# Patient Record
Sex: Female | Born: 1970 | Hispanic: Yes | Marital: Married | Smoking: Former smoker
Health system: Southern US, Community
[De-identification: ages and names within clinical notes are randomized; demographics above are authoritative.]

## PROBLEM LIST (undated history)

## (undated) DIAGNOSIS — E785 Hyperlipidemia, unspecified: Secondary | ICD-10-CM

## (undated) DIAGNOSIS — E119 Type 2 diabetes mellitus without complications: Secondary | ICD-10-CM

## (undated) DIAGNOSIS — I10 Essential (primary) hypertension: Secondary | ICD-10-CM

## (undated) HISTORY — PX: CHOLECYSTECTOMY: SHX55

## (undated) HISTORY — DX: Type 2 diabetes mellitus without complications: E11.9

## (undated) HISTORY — DX: Hyperlipidemia, unspecified: E78.5

## (undated) HISTORY — DX: Essential (primary) hypertension: I10

---

## 2007-05-06 ENCOUNTER — Ambulatory Visit: Payer: Self-pay | Admitting: Family Medicine

## 2007-06-17 ENCOUNTER — Ambulatory Visit: Payer: Self-pay | Admitting: Family Medicine

## 2007-09-16 ENCOUNTER — Ambulatory Visit: Payer: Self-pay | Admitting: Family Medicine

## 2007-12-23 HISTORY — PX: GASTRIC BYPASS: SHX52

## 2009-11-15 ENCOUNTER — Emergency Department: Payer: Self-pay | Admitting: Emergency Medicine

## 2010-08-06 LAB — HM PAP SMEAR

## 2010-11-28 ENCOUNTER — Ambulatory Visit: Payer: Self-pay

## 2011-06-08 ENCOUNTER — Emergency Department: Payer: Self-pay | Admitting: Emergency Medicine

## 2011-06-15 ENCOUNTER — Emergency Department: Payer: Self-pay | Admitting: Emergency Medicine

## 2013-04-29 ENCOUNTER — Emergency Department: Payer: Self-pay | Admitting: Unknown Physician Specialty

## 2013-04-29 LAB — HEPATIC FUNCTION PANEL A (ARMC)
Albumin: 3.7 g/dL (ref 3.4–5.0)
Alkaline Phosphatase: 108 U/L (ref 50–136)
Bilirubin,Total: 0.3 mg/dL (ref 0.2–1.0)
SGOT(AST): 26 U/L (ref 15–37)
Total Protein: 7.5 g/dL (ref 6.4–8.2)

## 2013-04-29 LAB — CBC
HGB: 14.5 g/dL (ref 12.0–16.0)
MCH: 29.8 pg (ref 26.0–34.0)
MCHC: 34.4 g/dL (ref 32.0–36.0)
MCV: 87 fL (ref 80–100)
Platelet: 223 10*3/uL (ref 150–440)
RDW: 13.3 % (ref 11.5–14.5)
WBC: 6.7 10*3/uL (ref 3.6–11.0)

## 2013-04-29 LAB — BASIC METABOLIC PANEL
Anion Gap: 8 (ref 7–16)
BUN: 12 mg/dL (ref 7–18)
Calcium, Total: 9.4 mg/dL (ref 8.5–10.1)
Glucose: 97 mg/dL (ref 65–99)
Potassium: 4.1 mmol/L (ref 3.5–5.1)

## 2013-04-29 LAB — LIPASE, BLOOD: Lipase: 102 U/L (ref 73–393)

## 2013-04-29 LAB — MAGNESIUM: Magnesium: 1.7 mg/dL — ABNORMAL LOW

## 2013-04-29 LAB — TROPONIN I: Troponin-I: <0.02 ng/mL

## 2014-07-30 ENCOUNTER — Emergency Department: Payer: Self-pay | Admitting: Emergency Medicine

## 2015-08-14 ENCOUNTER — Encounter: Payer: Self-pay | Admitting: Unknown Physician Specialty

## 2015-08-14 ENCOUNTER — Ambulatory Visit (INDEPENDENT_AMBULATORY_CARE_PROVIDER_SITE_OTHER): Payer: BLUE CROSS/BLUE SHIELD | Admitting: Unknown Physician Specialty

## 2015-08-14 VITALS — BP 112/77 | HR 69 | Temp 98.4°F | Ht 61.1 in | Wt 213.6 lb

## 2015-08-14 DIAGNOSIS — M545 Low back pain, unspecified: Secondary | ICD-10-CM

## 2015-08-14 DIAGNOSIS — E669 Obesity, unspecified: Secondary | ICD-10-CM | POA: Diagnosis not present

## 2015-08-14 DIAGNOSIS — M79609 Pain in unspecified limb: Secondary | ICD-10-CM | POA: Insufficient documentation

## 2015-08-14 DIAGNOSIS — I10 Essential (primary) hypertension: Secondary | ICD-10-CM

## 2015-08-14 DIAGNOSIS — E785 Hyperlipidemia, unspecified: Secondary | ICD-10-CM

## 2015-08-14 DIAGNOSIS — G4730 Sleep apnea, unspecified: Secondary | ICD-10-CM | POA: Insufficient documentation

## 2015-08-14 DIAGNOSIS — Z9884 Bariatric surgery status: Secondary | ICD-10-CM | POA: Diagnosis not present

## 2015-08-14 MED ORDER — TRAMADOL HCL 50 MG PO TABS: 50.0000 mg | ORAL_TABLET | Freq: Three times a day (TID) | ORAL | Status: DC | PRN

## 2015-08-14 MED ORDER — CYCLOBENZAPRINE HCL 10 MG PO TABS: 10.0000 mg | ORAL_TABLET | Freq: Three times a day (TID) | ORAL | Status: DC | PRN

## 2015-08-14 NOTE — Progress Notes (Signed)
BP 112/77 mmHg  Pulse 69  Temp(Src) 98.4 F (36.9 C)  Ht 5' 1.1" (1.552 m)  Wt 213 lb 9.6 oz (96.888 kg)  BMI 40.22 kg/m2  SpO2 100%  LMP  (LMP Unknown)   Subjective:    Patient ID: Chelsea Mayer, female    DOB: March 02, 1971, 44 y.o.   MRN: [id number]  HPI: Chelsea Mayer is a 44 y.o. female  Chief Complaint  Patient presents with  . Establish Care   Wants to get a physical.     Low back pain For 2 months.  States it comes and goes.  Worse with sitting and laying down.  Better when up and walking around.  States when she gets up she has to "slide off the bed."  Points to the left lower back and it is tender to the touch.    Relevant past medical, surgical, family and social history reviewed and updated as indicated. Interim medical history since our last visit reviewed. Allergies and medications reviewed and updated.  Review of Systems  Constitutional: Negative.   HENT: Negative.   Eyes: Negative.   Respiratory: Negative.   Cardiovascular: Negative.   Gastrointestinal: Negative.   Endocrine: Negative.   Genitourinary: Negative.   Musculoskeletal:       Having trouble with low back pain  Skin: Negative.   Allergic/Immunologic: Negative.   Neurological: Negative.   Hematological: Negative.   Psychiatric/Behavioral: Negative.     Per HPI unless specifically indicated above     Objective:    BP 112/77 mmHg  Pulse 69  Temp(Src) 98.4 F (36.9 C)  Ht 5' 1.1" (1.552 m)  Wt 213 lb 9.6 oz (96.888 kg)  BMI 40.22 kg/m2  SpO2 100%  LMP  (LMP Unknown)  Wt Readings from Last 3 Encounters:  08/14/15 213 lb 9.6 oz (96.888 kg)  03/14/11 172 lb (78.019 kg)    Physical Exam  Constitutional: She is oriented to person, place, and time. She appears well-developed and well-nourished. No distress.  HENT:  Head: Normocephalic and atraumatic.  Eyes: Conjunctivae and lids are normal. Right eye exhibits no discharge. Left eye exhibits no discharge. No scleral icterus.   Cardiovascular: Normal rate, regular rhythm and normal heart sounds.   Pulmonary/Chest: Effort normal and breath sounds normal. No respiratory distress.  Abdominal: Normal appearance and bowel sounds are normal. She exhibits no distension. There is no splenomegaly or hepatomegaly. There is no tenderness.  Musculoskeletal: Normal range of motion.  Neurological: She is alert and oriented to person, place, and time.  Skin: Skin is intact. No rash noted. No pallor.  Psychiatric: She has a normal mood and affect. Her behavior is normal. Judgment and thought content normal.  Vitals reviewed.   Results for orders placed or performed in visit on 08/14/15  HM PAP SMEAR  Result Value Ref Range   HM Pap smear from PP       Assessment & Plan:   Problem List Items Addressed This Visit      Unprioritized   S/P gastric bypass   Obesity    Other Visit Diagnoses    Left-sided low back pain without sciatica    -  Primary    Will get x-ray of LS spine.  Rx for Cyclobenzeprine TID prn.  Rx for Tramadol.  S/p gastric bypass surgery and unable to take NSAIDS.      Relevant Medications    cyclobenzaprine (FLEXERIL) 10 MG tablet    traMADol (ULTRAM) 50 MG tablet  Other Relevant Orders    DG Lumbar Spine Complete        Follow up plan: Return for physical.

## 2015-08-15 ENCOUNTER — Ambulatory Visit
Admission: RE | Admit: 2015-08-15 | Discharge: 2015-08-15 | Disposition: A | Discharge status: Home / Self Care | Payer: Self-pay | Source: Ambulatory Visit | Attending: Unknown Physician Specialty | Admitting: Unknown Physician Specialty

## 2015-08-16 ENCOUNTER — Ambulatory Visit
Admission: RE | Admit: 2015-08-16 | Discharge: 2015-08-16 | Disposition: A | Discharge status: Home / Self Care | Payer: BLUE CROSS/BLUE SHIELD | Source: Ambulatory Visit | Attending: Unknown Physician Specialty | Admitting: Unknown Physician Specialty

## 2015-08-16 DIAGNOSIS — M545 Low back pain, unspecified: Secondary | ICD-10-CM

## 2015-09-26 ENCOUNTER — Ambulatory Visit (INDEPENDENT_AMBULATORY_CARE_PROVIDER_SITE_OTHER): Payer: BLUE CROSS/BLUE SHIELD | Admitting: Unknown Physician Specialty

## 2015-09-26 ENCOUNTER — Encounter: Payer: Self-pay | Admitting: Unknown Physician Specialty

## 2015-09-26 VITALS — BP 128/89 | HR 75 | Temp 98.3°F | Ht 60.7 in | Wt 214.4 lb

## 2015-09-26 DIAGNOSIS — Z0000 Encounter for general adult medical examination without abnormal findings: Secondary | ICD-10-CM

## 2015-09-26 DIAGNOSIS — M545 Low back pain, unspecified: Secondary | ICD-10-CM | POA: Insufficient documentation

## 2015-09-26 DIAGNOSIS — Z23 Encounter for immunization: Secondary | ICD-10-CM | POA: Diagnosis not present

## 2015-09-26 DIAGNOSIS — Z9884 Bariatric surgery status: Secondary | ICD-10-CM | POA: Diagnosis not present

## 2015-09-26 NOTE — Patient Instructions (Addendum)
Use Myfitnesspal app for phone and track diet and exercise  Look for yoga exercises for back pain online.   Obesity Obesity is defined as having too much total body fat and a body mass index (BMI) of 30 or more. BMI is an estimate of body fat and is calculated from your height and weight. BMI is typically calculated by your health care provider during regular wellness visits. Obesity happens when you consume more calories than you can burn by exercising or performing daily physical tasks. Prolonged obesity can cause major illnesses or emergencies, such as: 1. Stroke. 2. Heart disease. 3. Diabetes. 4. Cancer. 5. Arthritis. 6. High blood pressure (hypertension). 7. High cholesterol. 8. Sleep apnea. 9. Erectile dysfunction. 10. Infertility problems. CAUSES  1. Regularly eating unhealthy foods. 2. Physical inactivity. 3. Certain disorders, such as an underactive thyroid (hypothyroidism), Cushing's syndrome, and polycystic ovarian syndrome. 4. Certain medicines, such as steroids, some depression medicines, and antipsychotics. 5. Genetics. 6. Lack of sleep. DIAGNOSIS A health care provider can diagnose obesity after calculating your BMI. Obesity will be diagnosed if your BMI is 30 or higher. There are other methods of measuring obesity levels. Some other methods include measuring your skinfold thickness, your waist circumference, and comparing your hip circumference to your waist circumference. TREATMENT  A healthy treatment program includes some or all of the following: 1. Long-term dietary changes. 2. Exercise and physical activity. 3. Behavioral and lifestyle changes. 4. Medicine only under the supervision of your health care provider. Medicines may help, but only if they are used with diet and exercise programs. If your BMI is 40 or higher, your health care provider may recommend specialized surgery or programs to help with weight loss. An unhealthy treatment program  includes: 1. Fasting. 2. Fad diets. 3. Supplements and drugs. These choices do not succeed in long-term weight control. HOME CARE INSTRUCTIONS 1. Exercise and perform physical activity as directed by your health care provider. To increase physical activity, try the following: 1. Use stairs instead of elevators. 2. Park farther away from store entrances. 3. Garden, bike, or walk instead of watching television or using the computer. 2. Eat healthy, low-calorie foods and drinks on a regular basis. Eat more fruits and vegetables. Use low-calorie cookbooks or take healthy cooking classes. 3. Limit fast food, sweets, and processed snack foods. 4. Eat smaller portions. 5. Keep a daily journal of everything you eat. There are many free websites to help you with this. It may be helpful to measure your foods so you can determine if you are eating the correct portion sizes. 6. Avoid drinking alcohol. Drink more water and drinks without calories. 7. Take vitamins and supplements only as recommended by your health care provider. 8. Weight-loss support groups, registered dietitians, counselors, and stress reduction education can also be very helpful. SEEK IMMEDIATE MEDICAL CARE IF: 1. You have chest pain or tightness. 2. You have trouble breathing or feel short of breath. 3. You have weakness or leg numbness. 4. You feel confused or have trouble talking. 5. You have sudden changes in your vision.   This information is not intended to replace advice given to you by your health care provider. Make sure you discuss any questions you have with your health care provider.   Document Released: 01/15/2005 Document Revised: 12/29/2014 Document Reviewed: 01/14/2012 Elsevier Interactive Patient Education 2016 [employer] Chronic Back Pain  When back pain lasts longer than 3 months, it is called chronic back pain.People with chronic back pain often go  through certain periods that are more intense (flare-ups).   CAUSES Chronic back pain can be caused by wear and tear (degeneration) on different structures in your back. These structures include: 11. The bones of your spine (vertebrae) and the joints surrounding your spinal cord and nerve roots (facets). 12. The strong, fibrous tissues that connect your vertebrae (ligaments). Degeneration of these structures may result in pressure on your nerves. This can lead to constant pain. HOME CARE INSTRUCTIONS 7. Avoid bending, heavy lifting, prolonged sitting, and activities which make the problem worse. 8. Take brief periods of rest throughout the day to reduce your pain. Lying down or standing usually is better than sitting while you are resting. 9. Take over-the-counter or prescription medicines only as directed by your caregiver. SEEK IMMEDIATE MEDICAL CARE IF:  5. You have weakness or numbness in one of your legs or feet. 6. You have trouble controlling your bladder or bowels. 7. You have nausea, vomiting, abdominal pain, shortness of breath, or fainting.   This information is not intended to replace advice given to you by your health care provider. Make sure you discuss any questions you have with your health care provider.   Document Released: 01/15/2005 Document Revised: 03/01/2012 Document Reviewed: 05/28/2015 Elsevier Interactive Patient Education 2016 Else[employer] Back Exercises The following exercises strengthen the muscles that help to support the back. They also help to keep the lower back flexible. Doing these exercises can help to prevent back pain or lessen existing pain. If you have back pain or discomfort, try doing these exercises 2-3 times each day or as told by your health care provider. When the pain goes away, do them once each day, but increase the number of times that you repeat the steps for each exercise (do more repetitions). If you do not have back pain or discomfort, do these exercises once each day or as told by your health care  provider. EXERCISES Single Knee to Chest Repeat these steps 3-5 times for each leg: 13. Lie on your back on a firm bed or the floor with your legs extended. 14. Bring one knee to your chest. Your other leg should stay extended and in contact with the floor. 87. Hold your knee in place by grabbing your knee or thigh. 16. Pull on your knee until you feel a gentle stretch in your lower back. 17. Hold the stretch for 10-30 seconds. 18. Slowly release and straighten your leg. Pelvic Tilt Repeat these steps 5-10 times: 10. Lie on your back on a firm bed or the floor with your legs extended. [place] your knees so they are pointing toward the ceiling and your feet are flat on the floor. 12. Tighten your lower abdominal muscles to press your lower back against the floor. This motion will tilt your pelvis so your tailbone points up toward the ceiling instead of pointing to your feet or the floor. 13. With gentle tension and even breathing, hold this position for 5-10 seconds. Cat-Cow Repeat these steps until your lower back becomes more flexible: 8. Get into a hands-and-knees position on a firm surface. Keep your hands under your shoulders, and keep your knees under your hips. You may place padding under your knees for comfort. 9. Let your head hang down, and point your tailbone toward the floor so your lower back becomes rounded like the back of a cat. 10. Hold this position for 5 seconds. 11. Slowly lift your head and point your tailbone up toward the ceiling so  your back forms a sagging arch like the back of a cow. 12. Hold this position for 5 seconds. Press-Ups Repeat these steps 5-10 times: 4. Lie on your abdomen (face-down) on the floor. 5. Place your palms near your head, about shoulder-width apart. 6. While you keep your back as relaxed as possible and keep your hips on the floor, slowly straighten your arms to raise the top half of your body and lift your shoulders. Do not use your back  muscles to raise your upper torso. You may adjust the placement of your hands to make yourself more comfortable. 7. Hold this position for 5 seconds while you keep your back relaxed. 8. Slowly return to lying flat on the floor. [place] Repeat these steps 10 times: 9. Lie on your back on a firm surface. [place] your knees so they are pointing toward the ceiling and your feet are flat on the floor. 69. Tighten your buttocks muscles and lift your buttocks off of the floor until your waist is at almost the same height as your knees. You should feel the muscles working in your buttocks and the back of your thighs. If you do not feel these muscles, slide your feet 1-2 inches farther away from your buttocks. 12. Hold this position for 3-5 seconds. 13. Slowly lower your hips to the starting position, and allow your buttocks muscles to relax completely. If this exercise is too easy, try doing it with your arms crossed over your chest. Abdominal Crunches Repeat these steps 5-10 times: 6. Lie on your back on a firm bed or the floor with your legs extended. 7. Bend your knees so they are pointing toward the ceiling and your feet are flat on the floor. 8. Cross your arms over your chest. 9. Tip your chin slightly toward your chest without bending your neck. 10. Tighten your abdominal muscles and slowly raise your trunk (torso) high enough to lift your shoulder blades a tiny bit off of the floor. Avoid raising your torso higher than that, because it can put too much stress on your low back and it does not help to strengthen your abdominal muscles. 11. Slowly return to your starting position. Back Lifts Repeat these steps 5-10 times: 1. Lie on your abdomen (face-down) with your arms at your sides, and rest your forehead on the floor. 2. Tighten the muscles in your legs and your buttocks. 3. Slowly lift your chest off of the floor while you keep your hips pressed to the floor. Keep the back of your head in  line with the curve in your back. Your eyes should be looking at the floor. 4. Hold this position for 3-5 seconds. 5. Slowly return to your starting position. SEEK MEDICAL CARE IF:  Your back pain or discomfort gets much worse when you do an exercise.  Your back pain or discomfort does not lessen within 2 hours after you exercise. If you have any of these problems, stop doing these exercises right away. Do not do them again unless your health care provider says that you can. SEEK IMMEDIATE MEDICAL CARE IF:  You develop sudden, severe back pain. If this happens, stop doing the exercises right away. Do not do them again unless your health care provider says that you can.   This information is not intended to replace advice given to you by your health care provider. Make sure you discuss any questions you have with your health care provider.   Document Released: 01/15/2005 Document Revised:  08/29/2015 Document Reviewed: 02/01/2015 Elsevier Interactive Patient Education [employer].

## 2015-09-26 NOTE — Assessment & Plan Note (Signed)
Pt ed on exercises and yoga videos

## 2015-09-26 NOTE — Progress Notes (Signed)
BP 128/89 mmHg  Pulse 75  Temp(Src) 98.3 F (36.8 C)  Ht 5' 0.7" (1.542 m)  Wt 214 lb 6.4 oz (97.251 kg)  BMI 40.90 kg/m2  SpO2 100%   Subjective:    Patient ID: Chelsea Mayer, female    DOB: January 11, 1971, 44 y.o.   MRN: [id number]  HPI: Chelsea Mayer is a 44 y.o. female  Chief Complaint  Patient presents with  . Annual Exam    pt states she would like to have a mammogram   Obesity She has tried exercising.  She states she drinks diet Pepsi.  States she is lactose and sugar intolerant.  Her food recall shows she eats little food besides her snacks.    Relevant past medical, surgical, family and social history reviewed and updated as indicated. Interim medical history since our last visit reviewed. Allergies and medications reviewed and updated.  Review of Systems  Constitutional: Negative.   HENT: Negative.   Eyes: Negative.   Respiratory: Negative.   Cardiovascular: Negative.   Gastrointestinal: Negative.   Endocrine: Negative.   Genitourinary: Negative.   Musculoskeletal: Positive for back pain.       Reviewed x-ray showing diffuse degenerative changes  Skin: Negative.   Allergic/Immunologic: Negative.   Neurological: Negative.   Hematological: Negative.   Psychiatric/Behavioral: Negative.     Per HPI unless specifically indicated above     Objective:    BP 128/89 mmHg  Pulse 75  Temp(Src) 98.3 F (36.8 C)  Ht 5' 0.7" (1.542 m)  Wt 214 lb 6.4 oz (97.251 kg)  BMI 40.90 kg/m2  SpO2 100%  Wt Readings from Last 3 Encounters:  09/26/15 214 lb 6.4 oz (97.251 kg)  08/14/15 213 lb 9.6 oz (96.888 kg)  03/14/11 172 lb (78.019 kg)    Physical Exam  Constitutional: She is oriented to person, place, and time. She appears well-developed and well-nourished.  HENT:  Head: Normocephalic and atraumatic.  Eyes: Pupils are equal, round, and reactive to light. Right eye exhibits no discharge. Left eye exhibits no discharge. No scleral icterus.  Neck: Normal range  of motion. Neck supple. Carotid bruit is not present. No thyromegaly present.  Cardiovascular: Normal rate, regular rhythm and normal heart sounds.  Exam reveals no gallop and no friction rub.   No murmur heard. Pulmonary/Chest: Effort normal and breath sounds normal. No respiratory distress. She has no wheezes. She has no rales.  Abdominal: Soft. Bowel sounds are normal. There is no tenderness. There is no rebound.  Genitourinary: No breast swelling, tenderness or discharge.  Musculoskeletal: Normal range of motion.  Lymphadenopathy:    She has no cervical adenopathy.  Neurological: She is alert and oriented to person, place, and time.  Skin: Skin is warm, dry and intact. No rash noted.  Psychiatric: She has a normal mood and affect. Her speech is normal and behavior is normal. Judgment and thought content normal. Cognition and memory are normal.       Assessment & Plan:   Problem List Items Addressed This Visit      Unprioritized   Low back pain    Pt ed on exercises and yoga videos       Other Visit Diagnoses    Immunization due    -  Primary    Relevant Orders    Flu Vaccine QUAD 36+ mos PF IM (Fluarix & Fluzone Quad PF) (Completed)    Annual physical exam        Relevant Orders  HIV antibody    Comprehensive metabolic panel    CBC with Differential/Platelet    TSH    IGP, Aptima HPV, rfx 16/18,45    Vit D  25 hydroxy (rtn osteoporosis monitoring)    Lipid Panel w/o Chol/HDL Ratio    Gastric bypass status for obesity        Relevant Orders    Vitamin B12        Follow up plan: Return if symptoms worsen or fail to improve.

## 2015-09-27 ENCOUNTER — Encounter: Payer: Self-pay | Admitting: Unknown Physician Specialty

## 2015-09-27 LAB — COMPREHENSIVE METABOLIC PANEL
A/G RATIO: 1.7 (ref 1.1–2.5)
ALBUMIN: 4 g/dL (ref 3.5–5.5)
ALK PHOS: 103 IU/L (ref 39–117)
ALT: 20 IU/L (ref 0–32)
AST: 17 IU/L (ref 0–40)
BUN / CREAT RATIO: 19 (ref 9–23)
BUN: 13 mg/dL (ref 6–24)
CO2: 24 mmol/L (ref 18–29)
CREATININE: 0.68 mg/dL (ref 0.57–1.00)
Calcium: 9.1 mg/dL (ref 8.7–10.2)
Chloride: 103 mmol/L (ref 97–108)
GFR calc Af Amer: 124 mL/min/{1.73_m2} (ref >59)
GFR calc non Af Amer: 107 mL/min/{1.73_m2} (ref >59)
GLOBULIN, TOTAL: 2.3 g/dL (ref 1.5–4.5)
Glucose: 99 mg/dL (ref 65–99)
Potassium: 4.3 mmol/L (ref 3.5–5.2)
Sodium: 140 mmol/L (ref 134–144)
Total Protein: 6.3 g/dL (ref 6.0–8.5)

## 2015-09-27 LAB — HIV ANTIBODY (ROUTINE TESTING W REFLEX): HIV Screen 4th Generation wRfx: NONREACTIVE

## 2015-09-27 LAB — CBC WITH DIFFERENTIAL/PLATELET
BASOS ABS: 0 10*3/uL (ref 0.0–0.2)
BASOS: 0 %
EOS (ABSOLUTE): 0.1 10*3/uL (ref 0.0–0.4)
Eos: 2 %
HEMOGLOBIN: 11.9 g/dL (ref 11.1–15.9)
Hematocrit: 35.4 % (ref 34.0–46.6)
IMMATURE GRANS (ABS): 0 10*3/uL (ref 0.0–0.1)
IMMATURE GRANULOCYTES: 0 %
LYMPHS: 32 %
Lymphocytes Absolute: 1.9 10*3/uL (ref 0.7–3.1)
MCH: 26 pg — AB (ref 26.6–33.0)
MCHC: 33.6 g/dL (ref 31.5–35.7)
MCV: 78 fL — ABNORMAL LOW (ref 79–97)
MONOCYTES: 7 %
Monocytes Absolute: 0.4 10*3/uL (ref 0.1–0.9)
NEUTROS ABS: 3.6 10*3/uL (ref 1.4–7.0)
NEUTROS PCT: 59 %
Platelets: 280 10*3/uL (ref 150–379)
RBC: 4.57 x10E6/uL (ref 3.77–5.28)
RDW: 14.4 % (ref 12.3–15.4)
WBC: 6 10*3/uL (ref 3.4–10.8)

## 2015-09-27 LAB — LIPID PANEL W/O CHOL/HDL RATIO
Cholesterol, Total: 224 mg/dL — ABNORMAL HIGH (ref 100–199)
HDL: 71 mg/dL (ref >39)
LDL CALC: 136 mg/dL — AB (ref 0–99)
Triglycerides: 85 mg/dL (ref 0–149)
VLDL CHOLESTEROL CAL: 17 mg/dL (ref 5–40)

## 2015-09-27 LAB — VITAMIN B12: Vitamin B-12: 616 pg/mL (ref 211–946)

## 2015-09-27 LAB — TSH: TSH: 1.34 u[IU]/mL (ref 0.450–4.500)

## 2015-09-27 LAB — VITAMIN D 25 HYDROXY (VIT D DEFICIENCY, FRACTURES): Vit D, 25-Hydroxy: 18 ng/mL — ABNORMAL LOW (ref 30.0–100.0)

## 2015-09-30 LAB — IGP, APTIMA HPV, RFX 16/18,45: PAP Smear Comment: 0

## 2016-03-14 IMAGING — CR DG LUMBAR SPINE COMPLETE 4+V
1 series · 5 of 5 positions shown · non-contrast
Comparison: None.

CLINICAL DATA: Back pain.

EXAM:
LUMBAR SPINE - COMPLETE 4+ VIEW

[Series 1: dg lumbar spine complete 4 +v · 0.14mm/px · 5 of 5 slices shown]
[im 1/5]
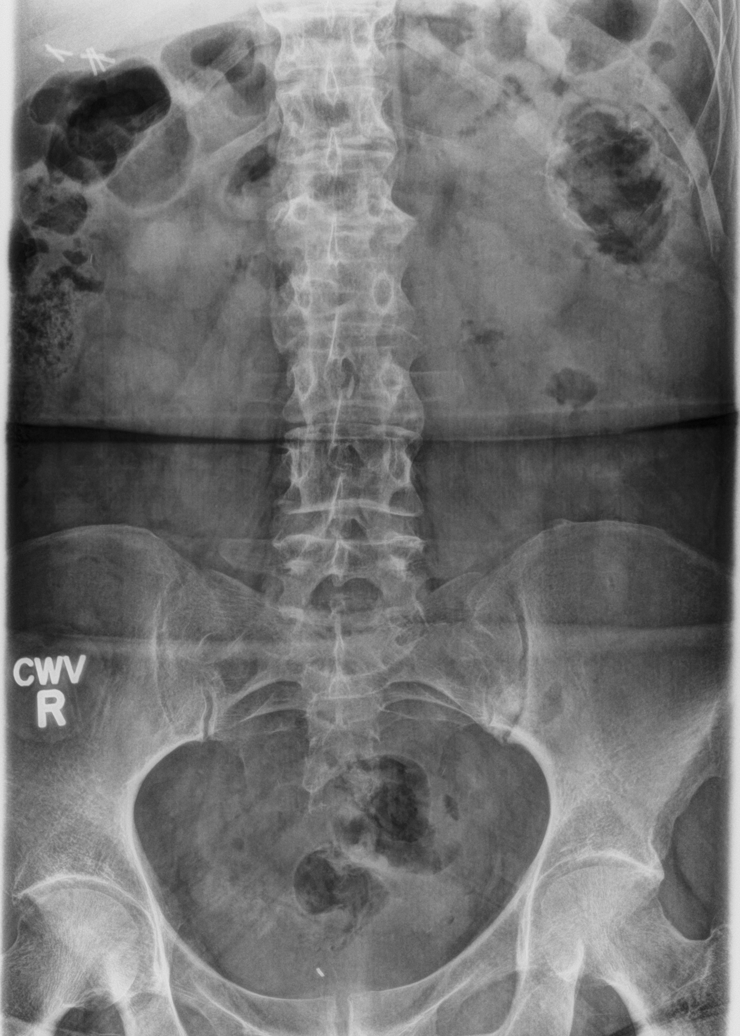
[im 2/5]
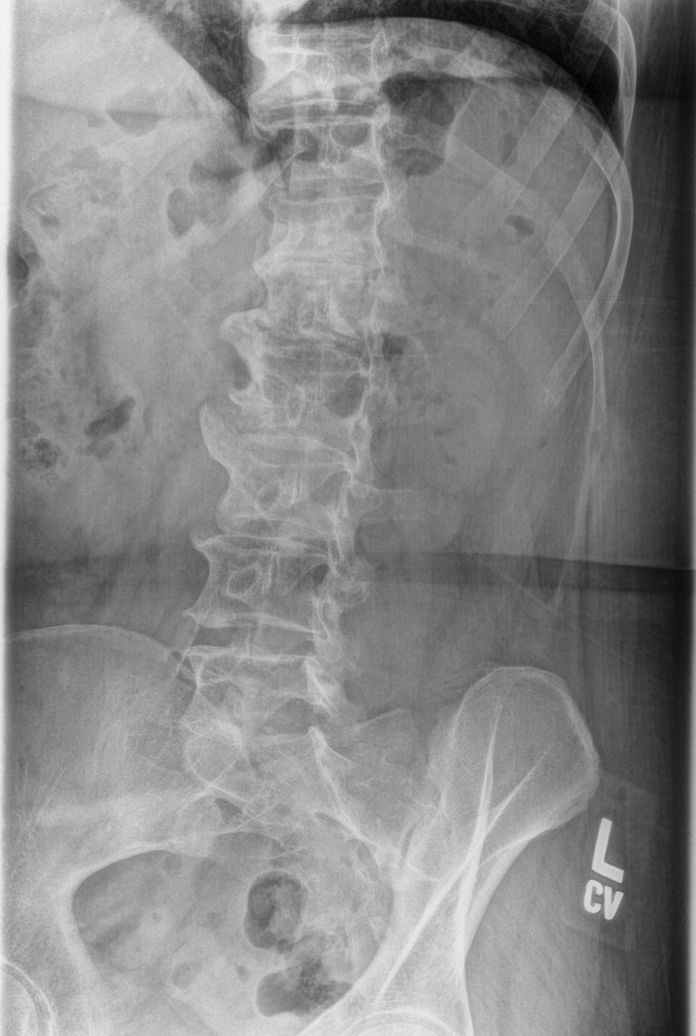
[im 3/5]
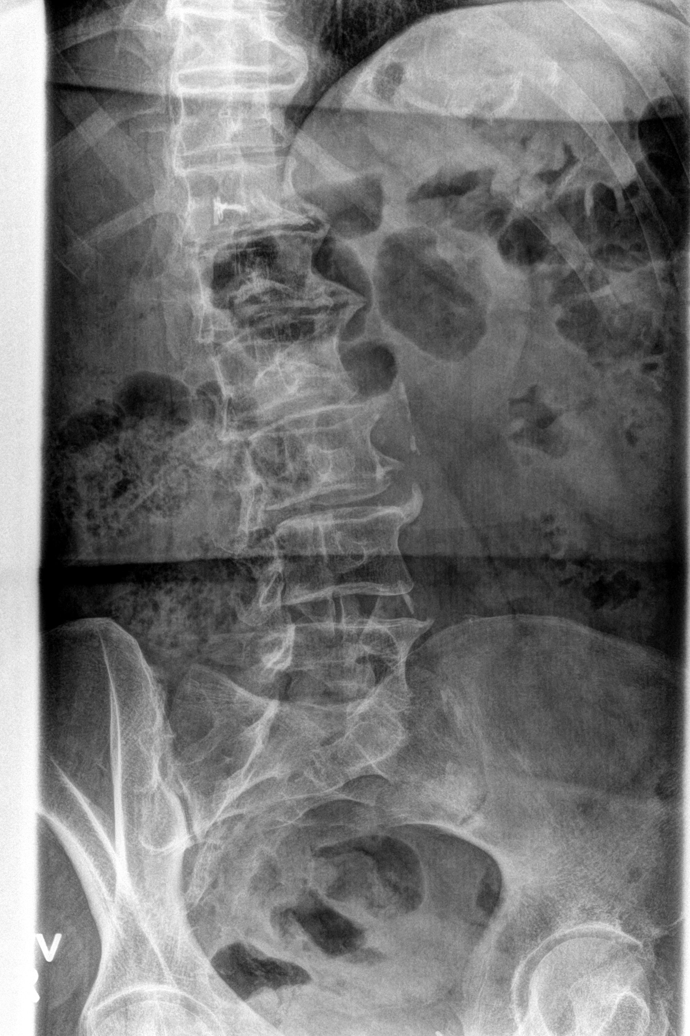
[im 4/5]
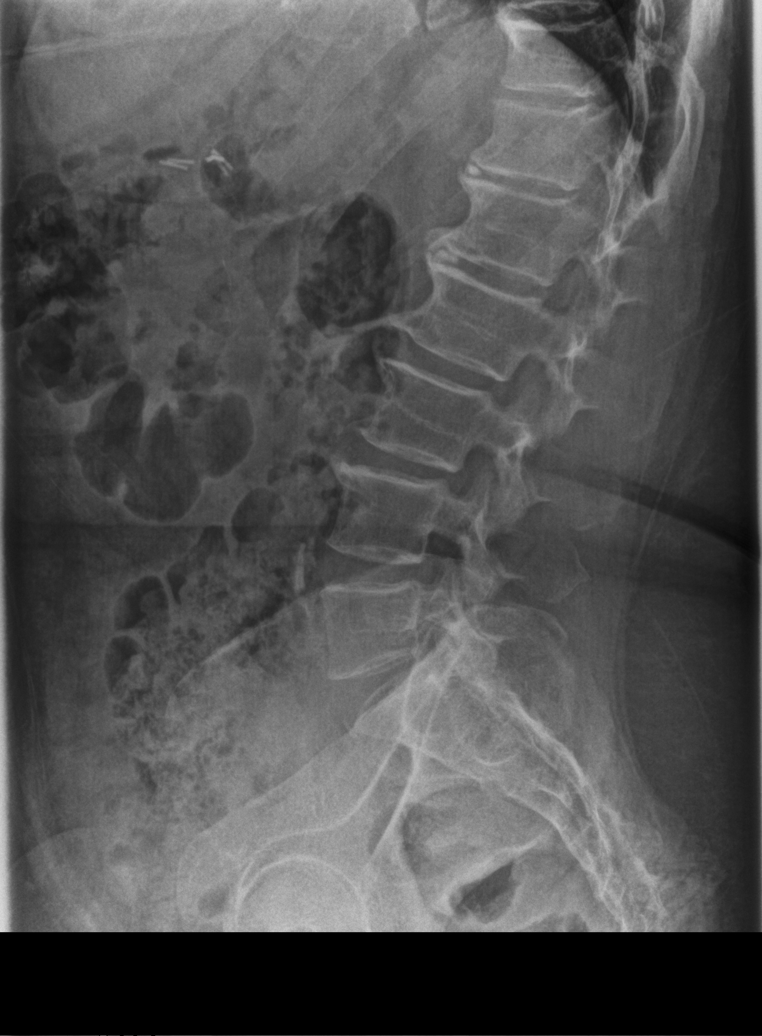
[im 5/5]
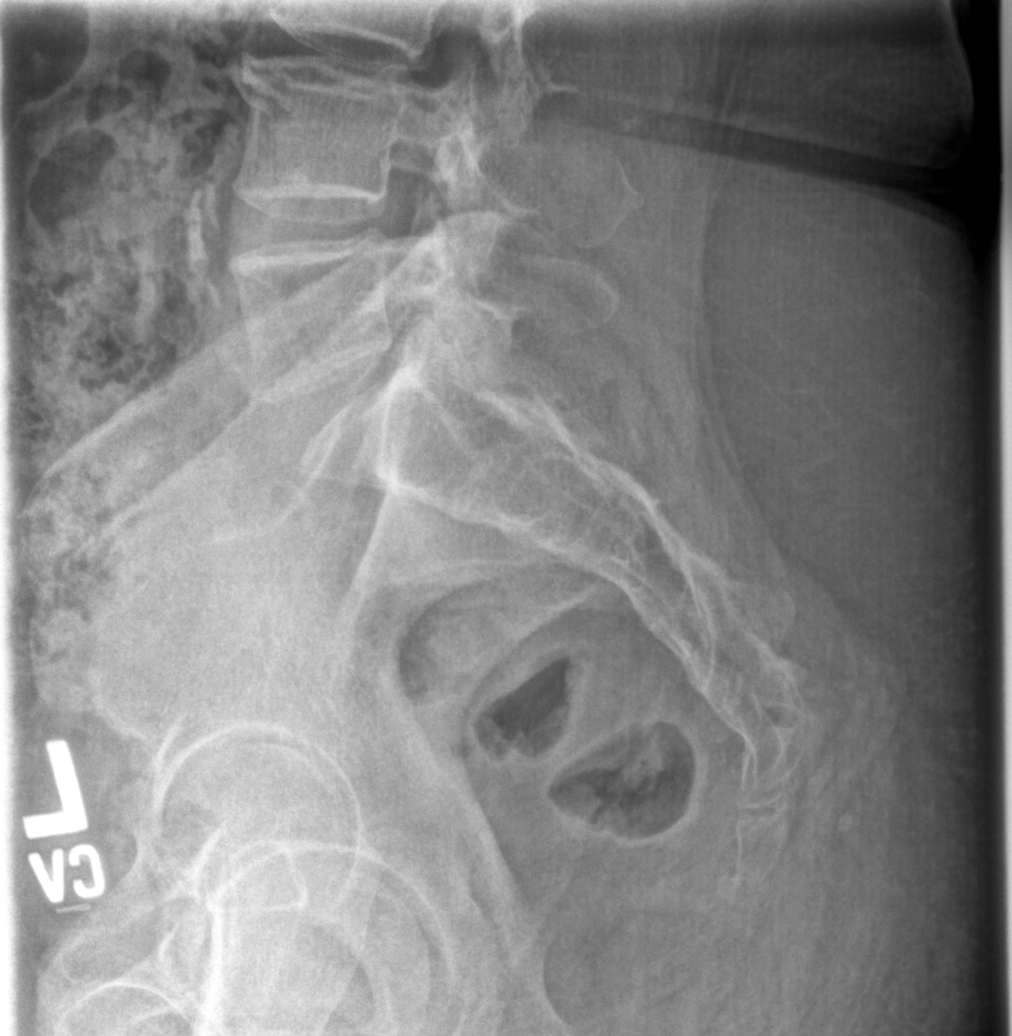

[5 of 5 positions shown; findings below may reference images not displayed]

FINDINGS: Paraspinal soft tissues are normal. Diffuse degenerative change
lumbar spine and both hips. Surgical clips right upper quadrant and
in the pelvis.
IMPRESSION: Diffuse degenerative changes lumbar spine. No acute abnormality
identified.

## 2016-09-25 ENCOUNTER — Telehealth: Payer: Self-pay | Admitting: Unknown Physician Specialty

## 2016-09-26 ENCOUNTER — Encounter: Payer: Self-pay | Admitting: Unknown Physician Specialty

## 2016-10-02 NOTE — Telephone Encounter (Signed)
Pt called to cancel an appt. The patient didn't not wish to reschedule at the time. Appt was cancelled for the patient.

## 2019-12-01 ENCOUNTER — Other Ambulatory Visit: Payer: Self-pay

## 2019-12-01 DIAGNOSIS — Z20822 Contact with and (suspected) exposure to covid-19: Secondary | ICD-10-CM

## 2019-12-03 LAB — NOVEL CORONAVIRUS, NAA: SARS-CoV-2, NAA: NOT DETECTED

## 2021-06-18 ENCOUNTER — Encounter: Payer: Self-pay | Admitting: Unknown Physician Specialty

## 2021-07-10 ENCOUNTER — Ambulatory Visit: Payer: BLUE CROSS/BLUE SHIELD | Admitting: Adult Health

## 2021-07-15 ENCOUNTER — Encounter: Payer: BLUE CROSS/BLUE SHIELD | Admitting: Adult Health

## 2021-07-21 DIAGNOSIS — M533 Sacrococcygeal disorders, not elsewhere classified: Secondary | ICD-10-CM | POA: Diagnosis not present

## 2021-07-21 DIAGNOSIS — R8271 Bacteriuria: Secondary | ICD-10-CM | POA: Diagnosis not present

## 2021-09-19 ENCOUNTER — Ambulatory Visit: Payer: BC Managed Care – PPO | Admitting: Nurse Practitioner

## 2021-09-19 ENCOUNTER — Other Ambulatory Visit: Payer: Self-pay

## 2021-09-19 ENCOUNTER — Encounter: Payer: Self-pay | Admitting: Nurse Practitioner

## 2021-09-19 VITALS — BP 120/92 | HR 84 | Temp 98.7°F | Ht 61.5 in | Wt 194.2 lb

## 2021-09-19 DIAGNOSIS — E119 Type 2 diabetes mellitus without complications: Secondary | ICD-10-CM | POA: Diagnosis not present

## 2021-09-19 DIAGNOSIS — Z7689 Persons encountering health services in other specified circumstances: Secondary | ICD-10-CM | POA: Diagnosis not present

## 2021-09-19 DIAGNOSIS — R030 Elevated blood-pressure reading, without diagnosis of hypertension: Secondary | ICD-10-CM

## 2021-09-19 DIAGNOSIS — Z1231 Encounter for screening mammogram for malignant neoplasm of breast: Secondary | ICD-10-CM

## 2021-09-19 NOTE — Assessment & Plan Note (Signed)
Chronic.  Controlled without medication since patient had gastric bypass. Will order labs at next visit.   Return to clinic in 1 months for reevaluation.  Call sooner if concerns arise.

## 2021-09-19 NOTE — Progress Notes (Signed)
BP (!) 120/92   Pulse 84   Temp 98.7 F (37.1 C) (Oral)   Ht 5' 1.5" (1.562 m)   Wt 194 lb 3.2 oz (88.1 kg)   SpO2 96%   BMI 36.10 kg/m    Subjective:    Patient ID: Chelsea Mayer, female    DOB: Sep 19, 1971, 50 y.o.   MRN: [id number]  HPI: Chelsea Mayer is a 50 y.o. female  Chief Complaint  Patient presents with   Establish Care    Patient presents to clinic to establish care with new PCP.  Patient reports a history of HTN, elevated cholesterol, diabetes, gastric bypass in 2009.  States she hasn't had any problems issues with diabetes or blood pressure since her surgery.   Patient denies a history of: Hypertension, Elevated Cholesterol, Diabetes, Thyroid problems, Depression, Anxiety, Neurological problems, and Abdominal problems.   Denies HA, CP, SOB, dizziness, palpitations, visual changes, and lower extremity swelling.  Active Ambulatory Problems    Diagnosis Date Noted   Hypertension 08/14/2015   Hyperlipidemia 08/14/2015   S/P gastric bypass 08/14/2015   Obesity 08/14/2015   Low back pain 09/26/2015   Diabetes mellitus type 2, uncomplicated ([place]) 64/33/2951   Resolved Ambulatory Problems    Diagnosis Date Noted   Sleep apnea 08/14/2015   Pain in limb 08/14/2015   Past Medical History:  Diagnosis Date   Diabetes mellitus without complication ([place])    Past Surgical History:  Procedure Laterality Date   CHOLECYSTECTOMY     GASTRIC BYPASS     Family History  Problem Relation Age of Onset   Cancer Mother        breast   Diabetes Father    Allergies Daughter      Review of Systems  Eyes:  Negative for visual disturbance.  Respiratory:  Negative for cough, chest tightness and shortness of breath.   Cardiovascular:  Negative for chest pain, palpitations and leg swelling.  Neurological:  Negative for dizziness and headaches.   Per HPI unless specifically indicated above     Objective:    BP (!) 120/92   Pulse 84   Temp 98.7 F (37.1 C)  (Oral)   Ht 5' 1.5" (1.562 m)   Wt 194 lb 3.2 oz (88.1 kg)   SpO2 96%   BMI 36.10 kg/m   Wt Readings from Last 3 Encounters:  09/19/21 194 lb 3.2 oz (88.1 kg)  09/26/15 214 lb 6.4 oz (97.3 kg)  08/14/15 213 lb 9.6 oz (96.9 kg)    Physical Exam Vitals and nursing note reviewed.  Constitutional:      General: She is not in acute distress.    Appearance: Normal appearance. She is normal weight. She is not ill-appearing, toxic-appearing or diaphoretic.  HENT:     Head: Normocephalic.     Right Ear: External ear normal.     Left Ear: External ear normal.     Nose: Nose normal.     Mouth/Throat:     Mouth: Mucous membranes are moist.     Pharynx: Oropharynx is clear.  Eyes:     General:        Right eye: No discharge.        Left eye: No discharge.     Extraocular Movements: Extraocular movements intact.     Conjunctiva/sclera: Conjunctivae normal.     Pupils: Pupils are equal, round, and reactive to light.  Cardiovascular:     Rate and Rhythm: Normal rate and regular rhythm.  Heart sounds: No murmur heard. Pulmonary:     Effort: Pulmonary effort is normal. No respiratory distress.     Breath sounds: Normal breath sounds. No wheezing or rales.  Musculoskeletal:     Cervical back: Normal range of motion and neck supple.  Skin:    General: Skin is warm and dry.     Capillary Refill: Capillary refill takes less than 2 seconds.  Neurological:     General: No focal deficit present.     Mental Status: She is alert and oriented to person, place, and time. Mental status is at baseline.  Psychiatric:        Mood and Affect: Mood normal.        Behavior: Behavior normal.        Thought Content: Thought content normal.        Judgment: Judgment normal.    Results for orders placed or performed in visit on 12/01/19  Novel Coronavirus, NAA ([facility])   Specimen: Nasopharyngeal(NP) swabs in vial transport medium   NASOPHARYNGE  TESTING  Result Value Ref Range   SARS-CoV-2, NAA  Not Detected Not Detected      Assessment & Plan:   Problem List Items Addressed This Visit       Endocrine   Diabetes mellitus type 2, uncomplicated ([facility])    Chronic.  Controlled without medication since patient had gastric bypass. Will order labs at next visit.   Return to clinic in 1 months for reevaluation.  Call sooner if concerns arise.        Other Visit Diagnoses     Elevated blood pressure reading    -  Primary   Elevated at visit today. Will follow up in 1 month for reevaluation. If still elevated at that time will discuss medication at that time.   Encounter for screening mammogram for malignant neoplasm of breast       Relevant Orders   MM Digital Screening   Encounter to establish care            Follow up plan: Return in about 1 month (around 10/19/2021) for Physical and Fasting labs and PAP.

## 2021-09-30 NOTE — Progress Notes (Signed)
BP 124/80   Pulse 76   Temp 98.1 F (36.7 C) (Oral)   Wt 189 lb (85.7 kg)   LMP  (LMP Unknown)   SpO2 98%   BMI 35.13 kg/m    Subjective:    Patient ID: Chelsea Mayer, female    DOB: Mar 25, 1971, 50 y.o.   MRN: [id number]  HPI: Chelsea Mayer is a 50 y.o. female  Chief Complaint  Patient presents with   Cyst    Pt states she has a cyst like area in her L axillary area, states she first noticed it about a week ago     Relevant past medical, surgical, family and social history reviewed and updated as indicated. Interim medical history since our last visit reviewed. Allergies and medications reviewed and updated.  Review of Systems  Skin:        Bump under left arm.   Per HPI unless specifically indicated above     Objective:    BP 124/80   Pulse 76   Temp 98.1 F (36.7 C) (Oral)   Wt 189 lb (85.7 kg)   LMP  (LMP Unknown)   SpO2 98%   BMI 35.13 kg/m   Wt Readings from Last 3 Encounters:  10/01/21 189 lb (85.7 kg)  09/19/21 194 lb 3.2 oz (88.1 kg)  09/26/15 214 lb 6.4 oz (97.3 kg)    Physical Exam Vitals and nursing note reviewed.  Constitutional:      General: She is not in acute distress.    Appearance: Normal appearance. She is normal weight. She is not ill-appearing, toxic-appearing or diaphoretic.  HENT:     Head: Normocephalic.     Right Ear: External ear normal.     Left Ear: External ear normal.     Nose: Nose normal.     Mouth/Throat:     Mouth: Mucous membranes are moist.     Pharynx: Oropharynx is clear.  Eyes:     General:        Right eye: No discharge.        Left eye: No discharge.     Extraocular Movements: Extraocular movements intact.     Conjunctiva/sclera: Conjunctivae normal.     Pupils: Pupils are equal, round, and reactive to light.  Cardiovascular:     Rate and Rhythm: Normal rate and regular rhythm.     Heart sounds: No murmur heard. Pulmonary:     Effort: Pulmonary effort is normal. No respiratory distress.     Breath  sounds: Normal breath sounds. No wheezing or rales.  Musculoskeletal:     Cervical back: Normal range of motion and neck supple.  Skin:    General: Skin is warm and dry.     Capillary Refill: Capillary refill takes less than 2 seconds.       Neurological:     General: No focal deficit present.     Mental Status: She is alert and oriented to person, place, and time. Mental status is at baseline.  Psychiatric:        Mood and Affect: Mood normal.        Behavior: Behavior normal.        Thought Content: Thought content normal.        Judgment: Judgment normal.    Results for orders placed or performed in visit on 12/01/19  Novel Coronavirus, NAA ([facility])   Specimen: Nasopharyngeal(NP) swabs in vial transport medium   NASOPHARYNGE  TESTING  Result Value Ref Range   SARS-CoV-2,  NAA Not Detected Not Detected      Assessment & Plan:   Problem List Items Addressed This Visit   None Visit Diagnoses     Lipoma of left upper extremity    -  Primary   Patient has what appears to be a lipoma underneath in left axilla. Will order diagnostic mammo due to radiology not wanting to do regular mammogram   Need for influenza vaccination       Relevant Orders   Flu Vaccine QUAD 61mo+IM (Fluarix, Fluzone & Alfiuria Quad PF) (Completed)   Encounter for screening mammogram for malignant neoplasm of breast       Relevant Orders   MM Digital Diagnostic Bilat        Follow up plan: Return if symptoms worsen or fail to improve.

## 2021-10-01 ENCOUNTER — Other Ambulatory Visit: Payer: Self-pay

## 2021-10-01 ENCOUNTER — Encounter: Payer: Self-pay | Admitting: Nurse Practitioner

## 2021-10-01 ENCOUNTER — Ambulatory Visit: Payer: BC Managed Care – PPO | Admitting: Nurse Practitioner

## 2021-10-01 VITALS — BP 124/80 | HR 76 | Temp 98.1°F | Wt 189.0 lb

## 2021-10-01 DIAGNOSIS — Z1231 Encounter for screening mammogram for malignant neoplasm of breast: Secondary | ICD-10-CM

## 2021-10-01 DIAGNOSIS — Z23 Encounter for immunization: Secondary | ICD-10-CM

## 2021-10-01 DIAGNOSIS — D1722 Benign lipomatous neoplasm of skin and subcutaneous tissue of left arm: Secondary | ICD-10-CM

## 2021-10-16 ENCOUNTER — Ambulatory Visit: Payer: BLUE CROSS/BLUE SHIELD | Admitting: Adult Health

## 2021-10-22 NOTE — Progress Notes (Signed)
Temp 98.1 F (36.7 C) (Oral)   Ht 5' 1.5" (1.562 m)   Wt 188 lb 6.4 oz (85.5 kg)   LMP  (LMP Unknown)   SpO2 98%   BMI 35.02 kg/m    Subjective:    Patient ID: Chelsea Mayer, female    DOB: 06-13-1971, 50 y.o.   MRN: [id number]  HPI: Chelsea Mayer is a 50 y.o. female presenting on 10/23/2021 for comprehensive medical examination. Current medical complaints include:none  She currently lives with: Menopausal Symptoms: no  HYPERTENSION / HYPERLIPIDEMIA Satisfied with current treatment? yes Duration of hypertension: years BP monitoring frequency: not checking BP range:  BP medication side effects: no Past BP meds: none Duration of hyperlipidemia: years Cholesterol medication side effects: no Cholesterol supplements: none Past cholesterol medications: none Medication compliance: excellent compliance Aspirin: no Recent stressors: no Recurrent headaches: no Visual changes: no Palpitations: no Dyspnea: no Chest pain: no Lower extremity edema: no Dizzy/lightheaded: no  DIABETES Hypoglycemic episodes:no Polydipsia/polyuria: no Visual disturbance: no Chest pain: no Paresthesias: no Glucose Monitoring: no  Accucheck frequency: Not Checking  Fasting glucose:  Post prandial:  Evening:  Before meals: Taking Insulin?: no  Long acting insulin:  Short acting insulin: Blood Pressure Monitoring: not checking Retinal Examination: Up to Date [facility] Foot Exam: Up to Date Diabetic Education: Not Completed Pneumovax:  Given today Influenza: Up to Date Aspirin: no  Depression Screen done today and results listed below:  Depression screen [facility] 2/9 10/23/2021 09/19/2021 09/26/2015  Decreased Interest 0 0 2  Down, Depressed, Hopeless 0 0 0  PHQ - 2 Score 0 0 2  Altered sleeping 0 0 -  Tired, decreased energy 1 1 -  Change in appetite 0 0 -  Feeling bad or failure about yourself  0 0 -  Trouble concentrating 0 0 -  Moving slowly or fidgety/restless 0 0 -  Suicidal  thoughts 0 0 -  PHQ-9 Score 1 1 -  Difficult doing work/chores Not difficult at all Not difficult at all -    The patient does not have a history of falls. I did complete a risk assessment for falls. A plan of care for falls was documented.   Past Medical History:  Past Medical History:  Diagnosis Date   Diabetes mellitus without complication ([place])    Hyperlipidemia    Hypertension     Surgical History:  Past Surgical History:  Procedure Laterality Date   CHOLECYSTECTOMY     GASTRIC BYPASS      Medications:  No current outpatient medications on file prior to visit.   No current facility-administered medications on file prior to visit.    Allergies:  No Known Allergies  Social History:  Social History   Socioeconomic History   Marital status: Married    Spouse name: Not on file   Number of children: Not on file   Years of education: Not on file   Highest education level: Not on file  Occupational History   Not on file  Tobacco Use   Smoking status: Some Days    Types: Cigarettes   Smokeless tobacco: Never  Vaping Use   Vaping Use: Never used  Substance and Sexual Activity   Alcohol use: No    Alcohol/week: 0.0 standard drinks   Drug use: No   Sexual activity: Yes  Other Topics Concern   Not on file  Social History Narrative   Not on file   Social Determinants of Health   Financial Resource Strain: Not  on file  Food Insecurity: Not on file  Transportation Needs: Not on file  Physical Activity: Not on file  Stress: Not on file  Social Connections: Not on file  Intimate Partner Violence: Not on file   Social History   Tobacco Use  Smoking Status Some Days   Types: Cigarettes  Smokeless Tobacco Never   Social History   Substance and Sexual Activity  Alcohol Use No   Alcohol/week: 0.0 standard drinks    Family History:  Family History  Problem Relation Age of Onset   Cancer Mother        breast   Diabetes Father    Allergies Daughter      Past medical history, surgical history, medications, allergies, family history and social history reviewed with patient today and changes made to appropriate areas of the chart.   Review of Systems  Eyes:  Negative for blurred vision and double vision.  Respiratory:  Negative for shortness of breath.   Cardiovascular:  Negative for chest pain, palpitations and leg swelling.  Neurological:  Negative for dizziness and headaches.  All other ROS negative except what is listed above and in the HPI.      Objective:    Temp 98.1 F (36.7 C) (Oral)   Ht 5' 1.5" (1.562 m)   Wt 188 lb 6.4 oz (85.5 kg)   LMP  (LMP Unknown)   SpO2 98%   BMI 35.02 kg/m   Wt Readings from Last 3 Encounters:  10/23/21 188 lb 6.4 oz (85.5 kg)  10/01/21 189 lb (85.7 kg)  09/19/21 194 lb 3.2 oz (88.1 kg)    Physical Exam Vitals and nursing note reviewed. Exam conducted with a chaperone present (Destiny [place], CMA).  Constitutional:      General: She is awake. She is not in acute distress.    Appearance: She is well-developed. She is obese. She is not ill-appearing.  HENT:     Head: Normocephalic and atraumatic.     Right Ear: Hearing, tympanic membrane, ear canal and external ear normal. No drainage.     Left Ear: Hearing, tympanic membrane, ear canal and external ear normal. No drainage.     Nose: Nose normal.     Right Sinus: No maxillary sinus tenderness or frontal sinus tenderness.     Left Sinus: No maxillary sinus tenderness or frontal sinus tenderness.     Mouth/Throat:     Mouth: Mucous membranes are moist.     Pharynx: Oropharynx is clear. Uvula midline. No pharyngeal swelling, oropharyngeal exudate or posterior oropharyngeal erythema.  Eyes:     General: Lids are normal.        Right eye: No discharge.        Left eye: No discharge.     Extraocular Movements: Extraocular movements intact.     Conjunctiva/sclera: Conjunctivae normal.     Pupils: Pupils are equal, round, and reactive to  light.     Visual Fields: Right eye visual fields normal and left eye visual fields normal.  Neck:     Thyroid: No thyromegaly.     Vascular: No carotid bruit.     Trachea: Trachea normal.  Cardiovascular:     Rate and Rhythm: Normal rate and regular rhythm.     Heart sounds: Normal heart sounds. No murmur heard.   No gallop.  Pulmonary:     Effort: Pulmonary effort is normal. No accessory muscle usage or respiratory distress.     Breath sounds: Normal breath sounds.  Chest:  Breasts:    Right: Normal.     Left: Normal.  Abdominal:     General: Bowel sounds are normal.     Palpations: Abdomen is soft. There is no hepatomegaly or splenomegaly.     Tenderness: There is no abdominal tenderness.  Genitourinary:    Vagina: Normal.     Cervix: Normal.     Adnexa: Right adnexa normal and left adnexa normal.  Musculoskeletal:        General: Normal range of motion.     Cervical back: Normal range of motion and neck supple.     Right lower leg: No edema.     Left lower leg: No edema.  Lymphadenopathy:     Head:     Right side of head: No submental, submandibular, tonsillar, preauricular or posterior auricular adenopathy.     Left side of head: No submental, submandibular, tonsillar, preauricular or posterior auricular adenopathy.     Cervical: No cervical adenopathy.     Upper Body:     Right upper body: No supraclavicular, axillary or pectoral adenopathy.     Left upper body: No supraclavicular, axillary or pectoral adenopathy.  Skin:    General: Skin is warm and dry.     Capillary Refill: Capillary refill takes less than 2 seconds.     Findings: No rash.  Neurological:     Mental Status: She is alert and oriented to person, place, and time.     Gait: Gait is intact.     Deep Tendon Reflexes: Reflexes are normal and symmetric.     Reflex Scores:      Brachioradialis reflexes are 2+ on the right side and 2+ on the left side.      Patellar reflexes are 2+ on the right side and  2+ on the left side. Psychiatric:        Attention and Perception: Attention normal.        Mood and Affect: Mood normal.        Speech: Speech normal.        Behavior: Behavior normal. Behavior is cooperative.        Thought Content: Thought content normal.        Judgment: Judgment normal.    Results for orders placed or performed in visit on 12/01/19  Novel Coronavirus, NAA ([facility])   Specimen: Nasopharyngeal(NP) swabs in vial transport medium   NASOPHARYNGE  TESTING  Result Value Ref Range   SARS-CoV-2, NAA Not Detected Not Detected      Assessment & Plan:   Problem List Items Addressed This Visit       Cardiovascular and Mediastinum   Hypertension    Chronic.  Controlled without medication. Labs ordered today.  Return to clinic in 6 months for reevaluation.  Call sooner if concerns arise.          Endocrine   Diabetes mellitus without complication (HCC)    Chronic.  Controlled without medication.  Labs ordered today.  Return to clinic in 6 months for reevaluation.  Call sooner if concerns arise.          Other   Hyperlipidemia    Labs ordered today.  Will make recommendations based on lab results. Follow up in 6 months for reservation.       Obesity    Recommend a healthy lifestyle of diet and exercise. Recommend smaller meals and prioritize protein.      Other Visit Diagnoses     Annual physical exam    -  Primary   Health maintenance reviewed during visit today. Labs ordered. PAP done. Up to date on flu.  Pneumonia vaccine given.   Relevant Orders   CBC with Differential/Platelet   Comprehensive metabolic panel   Lipid panel   TSH   Urinalysis, Routine w reflex microscopic   Cytology - PAP   Screening for cervical cancer       PAP obtained in normal fashion. Patient tolerated obtaining of specimen without complication. Escorted by Irena Reichmann, [place].   Encounter for hepatitis C screening test for low risk patient       Relevant Orders    Hepatitis C Antibody   Screening for HIV (human immunodeficiency virus)       Need for Streptococcus pneumoniae vaccination       Relevant Orders   Pneumococcal polysaccharide vaccine 23-valent greater than or equal to 2yo subcutaneous/IM (Completed)        Follow up plan: Return in about 6 months (around 04/22/2022) for HTN, HLD, DM2 FU.   LABORATORY TESTING:  - Pap smear: pap done  IMMUNIZATIONS:   - Tdap: Tetanus vaccination status reviewed: last tetanus booster within 10 years. - Influenza: Up to date - Pneumovax: Administered today - Prevnar: Not applicable - HPV: Not applicable - Zostavax vaccine: Not applicable  SCREENING: -Mammogram:  Ordered at last visit- reminded patient today   - Colonoscopy:  Discussed at visit. Will wait until 51.   - Bone Density: Not applicable  -Hearing Test: Not applicable  -Spirometry: Not applicable   PATIENT COUNSELING:   Advised to take 1 mg of folate supplement per day if capable of pregnancy.   Sexuality: Discussed sexually transmitted diseases, partner selection, use of condoms, avoidance of unintended pregnancy  and contraceptive alternatives.   Advised to avoid cigarette smoking.  I discussed with the patient that most people either abstain from alcohol or drink within safe limits (<=14/week and <=4 drinks/occasion for males, <=7/weeks and <= 3 drinks/occasion for females) and that the risk for alcohol disorders and other health effects rises proportionally with the number of drinks per week and how often a drinker exceeds daily limits.  Discussed cessation/primary prevention of drug use and availability of treatment for abuse.   Diet: Encouraged to adjust caloric intake to maintain  or achieve ideal body weight, to reduce intake of dietary saturated fat and total fat, to limit sodium intake by avoiding high sodium foods and not adding table salt, and to maintain adequate dietary potassium and calcium preferably from fresh fruits,  vegetables, and low-fat dairy products.    stressed the importance of regular exercise  Injury prevention: Discussed safety belts, safety helmets, smoke detector, smoking near bedding or upholstery.   Dental health: Discussed importance of regular tooth brushing, flossing, and dental visits.    NEXT PREVENTATIVE PHYSICAL DUE IN 1 YEAR. Return in about 6 months (around 04/22/2022) for HTN, HLD, DM2 FU.

## 2021-10-23 ENCOUNTER — Other Ambulatory Visit (HOSPITAL_COMMUNITY)
Admission: RE | Admit: 2021-10-23 | Discharge: 2021-10-23 | Disposition: A | Discharge status: Home / Self Care | Payer: BC Managed Care – PPO | Source: Ambulatory Visit | Attending: Nurse Practitioner | Admitting: Nurse Practitioner

## 2021-10-23 ENCOUNTER — Telehealth: Payer: Self-pay | Admitting: Nurse Practitioner

## 2021-10-23 ENCOUNTER — Ambulatory Visit (INDEPENDENT_AMBULATORY_CARE_PROVIDER_SITE_OTHER): Payer: BC Managed Care – PPO | Admitting: Nurse Practitioner

## 2021-10-23 ENCOUNTER — Telehealth: Payer: Self-pay

## 2021-10-23 ENCOUNTER — Other Ambulatory Visit: Payer: Self-pay

## 2021-10-23 ENCOUNTER — Encounter: Payer: Self-pay | Admitting: Nurse Practitioner

## 2021-10-23 VITALS — Temp 98.1°F | Ht 61.5 in | Wt 188.4 lb

## 2021-10-23 DIAGNOSIS — Z114 Encounter for screening for human immunodeficiency virus [HIV]: Secondary | ICD-10-CM

## 2021-10-23 DIAGNOSIS — Z6833 Body mass index (BMI) 33.0-33.9, adult: Secondary | ICD-10-CM

## 2021-10-23 DIAGNOSIS — E785 Hyperlipidemia, unspecified: Secondary | ICD-10-CM

## 2021-10-23 DIAGNOSIS — Z1159 Encounter for screening for other viral diseases: Secondary | ICD-10-CM | POA: Diagnosis not present

## 2021-10-23 DIAGNOSIS — Z124 Encounter for screening for malignant neoplasm of cervix: Secondary | ICD-10-CM

## 2021-10-23 DIAGNOSIS — E6609 Other obesity due to excess calories: Secondary | ICD-10-CM

## 2021-10-23 DIAGNOSIS — I10 Essential (primary) hypertension: Secondary | ICD-10-CM | POA: Diagnosis not present

## 2021-10-23 DIAGNOSIS — Z0000 Encounter for general adult medical examination without abnormal findings: Secondary | ICD-10-CM

## 2021-10-23 DIAGNOSIS — E119 Type 2 diabetes mellitus without complications: Secondary | ICD-10-CM

## 2021-10-23 DIAGNOSIS — Z23 Encounter for immunization: Secondary | ICD-10-CM

## 2021-10-23 LAB — URINALYSIS, ROUTINE W REFLEX MICROSCOPIC
Bilirubin, UA: NEGATIVE
Glucose, UA: NEGATIVE
Ketones, UA: NEGATIVE
Leukocytes,UA: NEGATIVE
Nitrite, UA: NEGATIVE
Protein,UA: NEGATIVE
RBC, UA: NEGATIVE
Specific Gravity, UA: 1.025 (ref 1.005–1.030)
Urobilinogen, Ur: 0.2 mg/dL (ref 0.2–1.0)
pH, UA: 5.5 (ref 5.0–7.5)

## 2021-10-23 NOTE — Assessment & Plan Note (Signed)
Chronic.  Controlled without medication..  Labs ordered today.  Return to clinic in 6 months for reevaluation.  Call sooner if concerns arise.  ° °

## 2021-10-23 NOTE — Telephone Encounter (Signed)
-----   Message from [name], NP sent at 10/23/2021  9:44 AM EDT ----- Can we call and get this patient's vision exam from [facility] off [address] in [place]

## 2021-10-23 NOTE — Progress Notes (Signed)
Hi Chelsea Mayer. Your urine from today looks good.  I will send you another message once the rest of your lab work comes back.

## 2021-10-23 NOTE — Telephone Encounter (Signed)
Patient recent Diabetic Eye Exam was requested at today's visit.

## 2021-10-23 NOTE — Telephone Encounter (Signed)
Copied from [id number] [id number]. Topic: General - Other >> Oct 23, 2021  2:44 PM Camille Bal, Gerlene Burdock wrote: Reason for CRM: Meagan from [facility] called in states that patient never told them that she is diabetic and they have no retnal xrays. They cant do a diabetic eye exam since they dot have info showing she is eye exam. They are not sure if this is a new dx for diabetes. Patient has only been seen for regular eye exam.

## 2021-10-23 NOTE — Assessment & Plan Note (Signed)
Recommend a healthy lifestyle of diet and exercise. Recommend smaller meals and prioritize protein.

## 2021-10-23 NOTE — Assessment & Plan Note (Signed)
Labs ordered today.  Will make recommendations based on lab results. Follow up in 6 months for reservation.

## 2021-10-24 LAB — CBC WITH DIFFERENTIAL/PLATELET
Basophils Absolute: 0 10*3/uL (ref 0.0–0.2)
Basos: 1 %
EOS (ABSOLUTE): 0.1 10*3/uL (ref 0.0–0.4)
Eos: 1 %
Hematocrit: 45.5 % (ref 34.0–46.6)
Hemoglobin: 15.1 g/dL (ref 11.1–15.9)
Immature Grans (Abs): 0 10*3/uL (ref 0.0–0.1)
Immature Granulocytes: 0 %
Lymphocytes Absolute: 1.8 10*3/uL (ref 0.7–3.1)
Lymphs: 33 %
MCH: 29.3 pg (ref 26.6–33.0)
MCHC: 33.2 g/dL (ref 31.5–35.7)
MCV: 88 fL (ref 79–97)
Monocytes Absolute: 0.4 10*3/uL (ref 0.1–0.9)
Monocytes: 7 %
Neutrophils Absolute: 3.3 10*3/uL (ref 1.4–7.0)
Neutrophils: 58 %
Platelets: 234 10*3/uL (ref 150–450)
RBC: 5.16 x10E6/uL (ref 3.77–5.28)
RDW: 12.5 % (ref 11.7–15.4)
WBC: 5.6 10*3/uL (ref 3.4–10.8)

## 2021-10-24 LAB — COMPREHENSIVE METABOLIC PANEL
ALT: 13 IU/L (ref 0–32)
AST: 14 IU/L (ref 0–40)
Albumin/Globulin Ratio: 1.8 (ref 1.2–2.2)
Albumin: 4.1 g/dL (ref 3.8–4.8)
Alkaline Phosphatase: 129 IU/L — ABNORMAL HIGH (ref 44–121)
BUN/Creatinine Ratio: 16 (ref 9–23)
BUN: 10 mg/dL (ref 6–24)
Bilirubin Total: 0.4 mg/dL (ref 0.0–1.2)
CO2: 23 mmol/L (ref 20–29)
Calcium: 9.4 mg/dL (ref 8.7–10.2)
Chloride: 102 mmol/L (ref 96–106)
Creatinine, Ser: 0.61 mg/dL (ref 0.57–1.00)
Globulin, Total: 2.3 g/dL (ref 1.5–4.5)
Glucose: 211 mg/dL — ABNORMAL HIGH (ref 70–99)
Potassium: 4.3 mmol/L (ref 3.5–5.2)
Sodium: 139 mmol/L (ref 134–144)
Total Protein: 6.4 g/dL (ref 6.0–8.5)
eGFR: 110 mL/min/{1.73_m2} (ref >59)

## 2021-10-24 LAB — TSH: TSH: 1.07 u[IU]/mL (ref 0.450–4.500)

## 2021-10-24 LAB — LIPID PANEL
Chol/HDL Ratio: 4.1 ratio (ref 0.0–4.4)
Cholesterol, Total: 230 mg/dL — ABNORMAL HIGH (ref 100–199)
HDL: 56 mg/dL (ref >39)
LDL Chol Calc (NIH): 156 mg/dL — ABNORMAL HIGH (ref 0–99)
Triglycerides: 104 mg/dL (ref 0–149)
VLDL Cholesterol Cal: 18 mg/dL (ref 5–40)

## 2021-10-24 LAB — HEPATITIS C ANTIBODY: Hep C Virus Ab: <0.1 s/co ratio (ref 0.0–0.9)

## 2021-10-24 LAB — HEMOGLOBIN A1C
Est. average glucose Bld gHb Est-mCnc: 272 mg/dL
Hgb A1c MFr Bld: 11.1 % — ABNORMAL HIGH (ref 4.8–5.6)

## 2021-10-24 NOTE — Progress Notes (Signed)
Please let patient know that his lab work shows that her diabetes is uncontrolled. A1c is 11.1.  Please see if she can come back in for medication discussion to address this.  Her cholesterol is also elevated.  We can discuss treatment options at the visit.  Other blood work looks good.  Please let me know if she has any questions.

## 2021-10-25 LAB — CYTOLOGY - PAP: Diagnosis: NEGATIVE

## 2021-10-25 NOTE — Progress Notes (Signed)
Hi [place].  Your PAP was normal.  We will repeat it in 3 years.

## 2021-10-28 ENCOUNTER — Encounter: Payer: Self-pay | Admitting: Nurse Practitioner

## 2021-10-28 ENCOUNTER — Other Ambulatory Visit: Payer: Self-pay

## 2021-10-28 ENCOUNTER — Ambulatory Visit: Payer: BC Managed Care – PPO | Admitting: Nurse Practitioner

## 2021-10-28 VITALS — BP 127/77 | HR 79 | Temp 97.8°F | Ht 61.5 in | Wt 190.6 lb

## 2021-10-28 DIAGNOSIS — E119 Type 2 diabetes mellitus without complications: Secondary | ICD-10-CM | POA: Diagnosis not present

## 2021-10-28 DIAGNOSIS — E785 Hyperlipidemia, unspecified: Secondary | ICD-10-CM

## 2021-10-28 MED ORDER — METFORMIN HCL 500 MG PO TABS: 500.0000 mg | ORAL_TABLET | Freq: Two times a day (BID) | ORAL | 3 refills | Status: DC

## 2021-10-28 MED ORDER — TIRZEPATIDE 2.5 MG/0.5ML ~~LOC~~ SOAJ: 2.5000 mg | SUBCUTANEOUS | 0 refills | Status: DC

## 2021-10-28 NOTE — Assessment & Plan Note (Addendum)
Chronic. Not well controlled.  A1c on 10/23/2021 was 11.1.  Patient started on Metformin 500mg  to be titrated up to 1000mg  BID as she tolerates it.  Discussed with patient how to do this properly.  Patient also started on [facility] for diabetes control.  Given sample and shown how to use it in office today.  Discussed side effects and benefits of both medications with patient during visit.  Follow up in 1 month for reevaluation and possible titration of medications.  Patient understands and agrees with the plan of care. Will plan to start ACE/ARB at next visit.

## 2021-10-28 NOTE — Assessment & Plan Note (Signed)
Chronic. Not well controlled. Discussed benefits of statin at visit today.  Will plan to start Crestor 5mg  daily at next visit. Patient understands and agrees with the plan of care.

## 2021-10-28 NOTE — Progress Notes (Signed)
BP 127/77   Pulse 79   Temp 97.8 F (36.6 C) (Oral)   Ht 5' 1.5" (1.562 m)   Wt 190 lb 9.6 oz (86.5 kg)   LMP  (LMP Unknown)   SpO2 99%   BMI 35.43 kg/m    Subjective:    Patient ID: Chelsea Mayer, female    DOB: [birth year], 50 y.o.   MRN: [id number]  HPI: Chelsea Mayer is a 50 y.o. female  Chief Complaint  Patient presents with   Results   DIABETES Patient is here to discuss her diabetes results.  Patient states when she goes to sleep at night she has been having some neuropathic pain.     Relevant past medical, surgical, family and social history reviewed and updated as indicated. Interim medical history since our last visit reviewed. Allergies and medications reviewed and updated.  Review of Systems  Neurological:        Neuropathic leg pain   Per HPI unless specifically indicated above     Objective:    BP 127/77   Pulse 79   Temp 97.8 F (36.6 C) (Oral)   Ht 5' 1.5" (1.562 m)   Wt 190 lb 9.6 oz (86.5 kg)   LMP  (LMP Unknown)   SpO2 99%   BMI 35.43 kg/m   Wt Readings from Last 3 Encounters:  10/28/21 190 lb 9.6 oz (86.5 kg)  10/23/21 188 lb 6.4 oz (85.5 kg)  10/01/21 189 lb (85.7 kg)    Physical Exam Vitals and nursing note reviewed.  Constitutional:      General: She is not in acute distress.    Appearance: Normal appearance. She is normal weight. She is not ill-appearing, toxic-appearing or diaphoretic.  HENT:     Head: Normocephalic.     Right Ear: External ear normal.     Left Ear: External ear normal.     Nose: Nose normal.     Mouth/Throat:     Mouth: Mucous membranes are moist.     Pharynx: Oropharynx is clear.  Eyes:     General:        Right eye: No discharge.        Left eye: No discharge.     Extraocular Movements: Extraocular movements intact.     Conjunctiva/sclera: Conjunctivae normal.     Pupils: Pupils are equal, round, and reactive to light.  Cardiovascular:     Rate and Rhythm: Normal rate and regular rhythm.      Heart sounds: No murmur heard. Pulmonary:     Effort: Pulmonary effort is normal. No respiratory distress.     Breath sounds: Normal breath sounds. No wheezing or rales.  Musculoskeletal:     Cervical back: Normal range of motion and neck supple.  Skin:    General: Skin is warm and dry.     Capillary Refill: Capillary refill takes less than 2 seconds.  Neurological:     General: No focal deficit present.     Mental Status: She is alert and oriented to person, place, and time. Mental status is at baseline.  Psychiatric:        Mood and Affect: Mood normal.        Behavior: Behavior normal.        Thought Content: Thought content normal.        Judgment: Judgment normal.    Results for orders placed or performed in visit on 10/23/21  HgB A1c  Result Value Ref Range   Hgb A1c  MFr Bld 11.1 (H) 4.8 - 5.6 %   Est. average glucose Bld gHb Est-mCnc 272 mg/dL  Hepatitis C Antibody  Result Value Ref Range   Hep C Virus Ab <0.1 0.0 - 0.9 s/co ratio  CBC with Differential/Platelet  Result Value Ref Range   WBC 5.6 3.4 - 10.8 x10E3/uL   RBC 5.16 3.77 - 5.28 x10E6/uL   Hemoglobin 15.1 11.1 - 15.9 g/dL   Hematocrit 45.5 34.0 - 46.6 %   MCV 88 79 - 97 fL   MCH 29.3 26.6 - 33.0 pg   MCHC 33.2 31.5 - 35.7 g/dL   RDW 12.5 11.7 - 15.4 %   Platelets 234 150 - 450 x10E3/uL   Neutrophils 58 Not Estab. %   Lymphs 33 Not Estab. %   Monocytes 7 Not Estab. %   Eos 1 Not Estab. %   Basos 1 Not Estab. %   Neutrophils Absolute 3.3 1.4 - 7.0 x10E3/uL   Lymphocytes Absolute 1.8 0.7 - 3.1 x10E3/uL   Monocytes Absolute 0.4 0.1 - 0.9 x10E3/uL   EOS (ABSOLUTE) 0.1 0.0 - 0.4 x10E3/uL   Basophils Absolute 0.0 0.0 - 0.2 x10E3/uL   Immature Granulocytes 0 Not Estab. %   Immature Grans (Abs) 0.0 0.0 - 0.1 x10E3/uL  Comprehensive metabolic panel  Result Value Ref Range   Glucose 211 (H) 70 - 99 mg/dL   BUN 10 6 - 24 mg/dL   Creatinine, Ser 0.61 0.57 - 1.00 mg/dL   eGFR 110 >59 mL/min/1.73    BUN/Creatinine Ratio 16 9 - 23   Sodium 139 134 - 144 mmol/L   Potassium 4.3 3.5 - 5.2 mmol/L   Chloride 102 96 - 106 mmol/L   CO2 23 20 - 29 mmol/L   Calcium 9.4 8.7 - 10.2 mg/dL   Total Protein 6.4 6.0 - 8.5 g/dL   Albumin 4.1 3.8 - 4.8 g/dL   Globulin, Total 2.3 1.5 - 4.5 g/dL   Albumin/Globulin Ratio 1.8 1.2 - 2.2   Bilirubin Total 0.4 0.0 - 1.2 mg/dL   Alkaline Phosphatase 129 (H) 44 - 121 IU/L   AST 14 0 - 40 IU/L   ALT 13 0 - 32 IU/L  Lipid panel  Result Value Ref Range   Cholesterol, Total 230 (H) 100 - 199 mg/dL   Triglycerides 104 0 - 149 mg/dL   HDL 56 >39 mg/dL   VLDL Cholesterol Cal 18 5 - 40 mg/dL   LDL Chol Calc (NIH) 156 (H) 0 - 99 mg/dL   Chol/HDL Ratio 4.1 0.0 - 4.4 ratio  TSH  Result Value Ref Range   TSH 1.070 0.450 - 4.500 uIU/mL  Urinalysis, Routine w reflex microscopic  Result Value Ref Range   Specific Gravity, UA 1.025 1.005 - 1.030   pH, UA 5.5 5.0 - 7.5   Color, UA Yellow Yellow   Appearance Ur Clear Clear   Leukocytes,UA Negative Negative   Protein,UA Negative Negative/Trace   Glucose, UA Negative Negative   Ketones, UA Negative Negative   RBC, UA Negative Negative   Bilirubin, UA Negative Negative   Urobilinogen, Ur 0.2 0.2 - 1.0 mg/dL   Nitrite, UA Negative Negative  Cytology - PAP  Result Value Ref Range   Adequacy      Satisfactory for evaluation; transformation zone component PRESENT.   Diagnosis      - Negative for intraepithelial lesion or malignancy (NILM)      Assessment & Plan:   Problem List Items Addressed This Visit  Endocrine   Diabetes mellitus without complication ([place]) - Primary    Chronic. Not well controlled.  A1c on 10/23/2021 was 11.1.  Patient started on Metformin 57m to be titrated up to 10044mBID as she tolerates it.  Discussed with patient how to do this properly.  [facility]  Given sample and shown how to use it in office today.  Discussed side effects and  benefits of both medications with patient during visit.  Follow up in 1 month for reevaluation and possible titration of medications.  Patient understands and agrees with the plan of care. Will plan to start ACE/ARB at next visit.       Relevant Medications   metFORMIN (GLUCOPHAGE) 500 MG tablet   tirzepatide (MOUNJARO) 2.5 MG/0.5ML Pen     Other   Hyperlipidemia    Chronic. Not well controlled. Discussed benefits of statin at visit today.  Will plan to start Crestor 26m31maily at next visit. Patient understands and agrees with the plan of care.         Follow up plan: Return in about 1 month (around 11/27/2021) for Diabetes check.

## 2021-10-30 ENCOUNTER — Telehealth: Payer: Self-pay

## 2021-10-30 NOTE — Telephone Encounter (Signed)
Prior Authorization was initiated for prescription [facility].  PYP:[id number]

## 2021-11-19 NOTE — Telephone Encounter (Signed)
Can we please check on the status of this PA?

## 2021-11-26 NOTE — Progress Notes (Signed)
BP 119/86   Pulse 88   Temp 98.7 F (37.1 C) (Oral)   Ht 5' 1.5" (1.562 m)   Wt 182 lb (82.6 kg)   SpO2 99%   BMI 33.84 kg/m    Subjective:    Patient ID: Chelsea Mayer, female    DOB: 1971/11/11, 50 y.o.   MRN: [id number]  HPI: Chelsea Mayer is a 50 y.o. female  Chief Complaint  Patient presents with   Diabetes   DIABETES Patient is here to follow up on her diabetes.  She was started on Mounjaro at her last visit.  She states she is tolerating the medication well.  Patient states she is has lost 8lbs since last visit.  It is helping curb her cravings.  Denies any symptoms of low blood sugar, polyuria, and polydipsia.  Her blood sugar was 108 this morning.   She is having some burning sensation in her legs and feels like it is worse at night. Sometimes they are cramping.     Relevant past medical, surgical, family and social history reviewed and updated as indicated. Interim medical history since our last visit reviewed. Allergies and medications reviewed and updated.  Review of Systems  Eyes:  Negative for visual disturbance.  Cardiovascular:  Negative for chest pain.  Endocrine: Negative for polydipsia and polyuria.  Neurological:  Negative for numbness.       Neuropathic leg pain   Per HPI unless specifically indicated above     Objective:    BP 119/86   Pulse 88   Temp 98.7 F (37.1 C) (Oral)   Ht 5' 1.5" (1.562 m)   Wt 182 lb (82.6 kg)   SpO2 99%   BMI 33.84 kg/m   Wt Readings from Last 3 Encounters:  11/27/21 182 lb (82.6 kg)  10/28/21 190 lb 9.6 oz (86.5 kg)  10/23/21 188 lb 6.4 oz (85.5 kg)    Physical Exam Vitals and nursing note reviewed.  Constitutional:      General: She is not in acute distress.    Appearance: Normal appearance. She is normal weight. She is not ill-appearing, toxic-appearing or diaphoretic.  HENT:     Head: Normocephalic.     Right Ear: External ear normal.     Left Ear: External ear normal.     Nose: Nose normal.      Mouth/Throat:     Mouth: Mucous membranes are moist.     Pharynx: Oropharynx is clear.  Eyes:     General:        Right eye: No discharge.        Left eye: No discharge.     Extraocular Movements: Extraocular movements intact.     Conjunctiva/sclera: Conjunctivae normal.     Pupils: Pupils are equal, round, and reactive to light.  Cardiovascular:     Rate and Rhythm: Normal rate and regular rhythm.     Heart sounds: No murmur heard. Pulmonary:     Effort: Pulmonary effort is normal. No respiratory distress.     Breath sounds: Normal breath sounds. No wheezing or rales.  Musculoskeletal:     Cervical back: Normal range of motion and neck supple.  Skin:    General: Skin is warm and dry.     Capillary Refill: Capillary refill takes less than 2 seconds.  Neurological:     General: No focal deficit present.     Mental Status: She is alert and oriented to person, place, and time. Mental status is at baseline.  Psychiatric:        Mood and Affect: Mood normal.        Behavior: Behavior normal.        Thought Content: Thought content normal.        Judgment: Judgment normal.    Results for orders placed or performed in visit on 10/23/21  HgB A1c  Result Value Ref Range   Hgb A1c MFr Bld 11.1 (H) 4.8 - 5.6 %   Est. average glucose Bld gHb Est-mCnc 272 mg/dL  Hepatitis C Antibody  Result Value Ref Range   Hep C Virus Ab <0.1 0.0 - 0.9 s/co ratio  CBC with Differential/Platelet  Result Value Ref Range   WBC 5.6 3.4 - 10.8 x10E3/uL   RBC 5.16 3.77 - 5.28 x10E6/uL   Hemoglobin 15.1 11.1 - 15.9 g/dL   Hematocrit 45.5 34.0 - 46.6 %   MCV 88 79 - 97 fL   MCH 29.3 26.6 - 33.0 pg   MCHC 33.2 31.5 - 35.7 g/dL   RDW 12.5 11.7 - 15.4 %   Platelets 234 150 - 450 x10E3/uL   Neutrophils 58 Not Estab. %   Lymphs 33 Not Estab. %   Monocytes 7 Not Estab. %   Eos 1 Not Estab. %   Basos 1 Not Estab. %   Neutrophils Absolute 3.3 1.4 - 7.0 x10E3/uL   Lymphocytes Absolute 1.8 0.7 - 3.1  x10E3/uL   Monocytes Absolute 0.4 0.1 - 0.9 x10E3/uL   EOS (ABSOLUTE) 0.1 0.0 - 0.4 x10E3/uL   Basophils Absolute 0.0 0.0 - 0.2 x10E3/uL   Immature Granulocytes 0 Not Estab. %   Immature Grans (Abs) 0.0 0.0 - 0.1 x10E3/uL  Comprehensive metabolic panel  Result Value Ref Range   Glucose 211 (H) 70 - 99 mg/dL   BUN 10 6 - 24 mg/dL   Creatinine, Ser 0.61 0.57 - 1.00 mg/dL   eGFR 110 >59 mL/min/1.73   BUN/Creatinine Ratio 16 9 - 23   Sodium 139 134 - 144 mmol/L   Potassium 4.3 3.5 - 5.2 mmol/L   Chloride 102 96 - 106 mmol/L   CO2 23 20 - 29 mmol/L   Calcium 9.4 8.7 - 10.2 mg/dL   Total Protein 6.4 6.0 - 8.5 g/dL   Albumin 4.1 3.8 - 4.8 g/dL   Globulin, Total 2.3 1.5 - 4.5 g/dL   Albumin/Globulin Ratio 1.8 1.2 - 2.2   Bilirubin Total 0.4 0.0 - 1.2 mg/dL   Alkaline Phosphatase 129 (H) 44 - 121 IU/L   AST 14 0 - 40 IU/L   ALT 13 0 - 32 IU/L  Lipid panel  Result Value Ref Range   Cholesterol, Total 230 (H) 100 - 199 mg/dL   Triglycerides 104 0 - 149 mg/dL   HDL 56 >39 mg/dL   VLDL Cholesterol Cal 18 5 - 40 mg/dL   LDL Chol Calc (NIH) 156 (H) 0 - 99 mg/dL   Chol/HDL Ratio 4.1 0.0 - 4.4 ratio  TSH  Result Value Ref Range   TSH 1.070 0.450 - 4.500 uIU/mL  Urinalysis, Routine w reflex microscopic  Result Value Ref Range   Specific Gravity, UA 1.025 1.005 - 1.030   pH, UA 5.5 5.0 - 7.5   Color, UA Yellow Yellow   Appearance Ur Clear Clear   Leukocytes,UA Negative Negative   Protein,UA Negative Negative/Trace   Glucose, UA Negative Negative   Ketones, UA Negative Negative   RBC, UA Negative Negative   Bilirubin, UA Negative Negative  Urobilinogen, Ur 0.2 0.2 - 1.0 mg/dL   Nitrite, UA Negative Negative  Cytology - PAP  Result Value Ref Range   Adequacy      Satisfactory for evaluation; transformation zone component PRESENT.   Diagnosis      - Negative for intraepithelial lesion or malignancy (NILM)      Assessment & Plan:   Problem List Items Addressed This Visit        Endocrine   Type 2 diabetes mellitus without complication, without long-term current use of insulin ([place]) - Primary    Mounjaro was approved by [employer].  Patient has titrated up to Metformin 1041m BID.  Tolerating both medications well.  Has lost 8lbs since last visit.  Follow up in 2 months for A1c check.       Type 2 diabetes mellitus with diabetic neuropathy, without long-term current use of insulin ([facility])    Will start Gabapentin 1072mat bedtime for neuropathy pain. Can increase dose if needed based on patient's symptoms.  Side effects and benefits discussed at visit today.  Follow up in 2 months.         Follow up plan: Return in about 2 months (around 01/28/2022) for HTN, HLD, DM2 FU.

## 2021-11-27 ENCOUNTER — Encounter: Payer: Self-pay | Admitting: Nurse Practitioner

## 2021-11-27 ENCOUNTER — Ambulatory Visit (INDEPENDENT_AMBULATORY_CARE_PROVIDER_SITE_OTHER): Payer: BC Managed Care – PPO | Admitting: Nurse Practitioner

## 2021-11-27 ENCOUNTER — Other Ambulatory Visit: Payer: Self-pay

## 2021-11-27 VITALS — BP 119/86 | HR 88 | Temp 98.7°F | Ht 61.5 in | Wt 182.0 lb

## 2021-11-27 DIAGNOSIS — E1140 Type 2 diabetes mellitus with diabetic neuropathy, unspecified: Secondary | ICD-10-CM

## 2021-11-27 DIAGNOSIS — E119 Type 2 diabetes mellitus without complications: Secondary | ICD-10-CM

## 2021-11-27 MED ORDER — GABAPENTIN 100 MG PO CAPS: 100.0000 mg | ORAL_CAPSULE | Freq: Every day | ORAL | 1 refills | Status: DC

## 2021-11-27 NOTE — Assessment & Plan Note (Signed)
Chelsea Mayer was approved by [employer].  Patient has titrated up to Metformin 1000mg  BID.  Tolerating both medications well.  Has lost 8lbs since last visit.  Follow up in 2 months for A1c check.

## 2021-11-27 NOTE — Assessment & Plan Note (Signed)
Will start Gabapentin 100mg  at bedtime for neuropathy pain. Can increase dose if needed based on patient's symptoms.  Side effects and benefits discussed at visit today.  Follow up in 2 months.

## 2022-01-15 ENCOUNTER — Other Ambulatory Visit: Payer: Self-pay | Admitting: Nurse Practitioner

## 2022-01-16 MED ORDER — TIRZEPATIDE 2.5 MG/0.5ML ~~LOC~~ SOAJ: 2.5000 mg | SUBCUTANEOUS | 0 refills | Status: DC

## 2022-01-18 ENCOUNTER — Encounter: Payer: Self-pay | Admitting: Nurse Practitioner

## 2022-01-27 NOTE — Progress Notes (Signed)
BP 111/77    Pulse 81    Temp 97.9 F (36.6 C) (Oral)    Ht 5' 1.5" (1.562 m)    Wt 175 lb 9.6 oz (79.7 kg)    SpO2 100%    BMI 32.64 kg/m    Subjective:    Patient ID: Chelsea Mayer, female    DOB: 1971-08-19, 51 y.o.   MRN: [id number]  HPI: Chelsea Mayer is a 51 y.o. female  Chief Complaint  Patient presents with   Hypertension   Hyperlipidemia   Diabetes    Patient denies having a recent Diabetic Eye Exam. Patient most recent was requested at today's visit. Patient states she has not been able to take her shot since last week as she is still waiting on her insurance.    HYPERTENSION / HYPERLIPIDEMIA Satisfied with current treatment? no Duration of hypertension: years BP monitoring frequency: not checking BP range:  BP medication side effects: no Past BP meds: none Duration of hyperlipidemia: years Cholesterol medication side effects: no Cholesterol supplements: none Past cholesterol medications: none Medication compliance:  NA Aspirin: no Recent stressors: no Recurrent headaches: no Visual changes: no Palpitations: no Dyspnea: no Chest pain: no Lower extremity edema: no Dizzy/lightheaded: no  DIABETES Hypoglycemic episodes:no Polydipsia/polyuria: no Visual disturbance: no Chest pain: no Paresthesias: no Glucose Monitoring: no  Accucheck frequency: Not Checking  Fasting glucose:  Post prandial:  Evening:  Before meals: Taking Insulin?: no  Long acting insulin:  Short acting insulin: Blood Pressure Monitoring: not checking Retinal Examination: Up to Date Foot Exam: Up to Date Diabetic Education: Not Completed Pneumovax: Up to Date Influenza: Up to Date Aspirin: no  Relevant past medical, surgical, family and social history reviewed and updated as indicated. Interim medical history since our last visit reviewed. Allergies and medications reviewed and updated.  Review of Systems  Eyes:  Negative for visual disturbance.  Respiratory:  Negative  for chest tightness and shortness of breath.   Cardiovascular:  Negative for chest pain, palpitations and leg swelling.  Endocrine: Negative for polydipsia and polyuria.  Neurological:  Negative for dizziness, light-headedness, numbness and headaches.   Per HPI unless specifically indicated above     Objective:    BP 111/77    Pulse 81    Temp 97.9 F (36.6 C) (Oral)    Ht 5' 1.5" (1.562 m)    Wt 175 lb 9.6 oz (79.7 kg)    SpO2 100%    BMI 32.64 kg/m   Wt Readings from Last 3 Encounters:  01/28/22 175 lb 9.6 oz (79.7 kg)  11/27/21 182 lb (82.6 kg)  10/28/21 190 lb 9.6 oz (86.5 kg)    Physical Exam Vitals and nursing note reviewed.  Constitutional:      General: She is not in acute distress.    Appearance: Normal appearance. She is obese. She is not ill-appearing, toxic-appearing or diaphoretic.  HENT:     Head: Normocephalic.     Right Ear: External ear normal.     Left Ear: External ear normal.     Nose: Nose normal.     Mouth/Throat:     Mouth: Mucous membranes are moist.     Pharynx: Oropharynx is clear.  Eyes:     General:        Right eye: No discharge.        Left eye: No discharge.     Extraocular Movements: Extraocular movements intact.     Conjunctiva/sclera: Conjunctivae normal.     Pupils:  Pupils are equal, round, and reactive to light.  Cardiovascular:     Rate and Rhythm: Normal rate and regular rhythm.     Heart sounds: No murmur heard. Pulmonary:     Effort: Pulmonary effort is normal. No respiratory distress.     Breath sounds: Normal breath sounds. No wheezing or rales.  Musculoskeletal:     Cervical back: Normal range of motion and neck supple.  Skin:    General: Skin is warm and dry.     Capillary Refill: Capillary refill takes less than 2 seconds.  Neurological:     General: No focal deficit present.     Mental Status: She is alert and oriented to person, place, and time. Mental status is at baseline.  Psychiatric:        Mood and Affect: Mood  normal.        Behavior: Behavior normal.        Thought Content: Thought content normal.        Judgment: Judgment normal.    Results for orders placed or performed in visit on 10/23/21  HgB A1c  Result Value Ref Range   Hgb A1c MFr Bld 11.1 (H) 4.8 - 5.6 %   Est. average glucose Bld gHb Est-mCnc 272 mg/dL  Hepatitis C Antibody  Result Value Ref Range   Hep C Virus Ab <0.1 0.0 - 0.9 s/co ratio  CBC with Differential/Platelet  Result Value Ref Range   WBC 5.6 3.4 - 10.8 x10E3/uL   RBC 5.16 3.77 - 5.28 x10E6/uL   Hemoglobin 15.1 11.1 - 15.9 g/dL   Hematocrit 45.5 34.0 - 46.6 %   MCV 88 79 - 97 fL   MCH 29.3 26.6 - 33.0 pg   MCHC 33.2 31.5 - 35.7 g/dL   RDW 12.5 11.7 - 15.4 %   Platelets 234 150 - 450 x10E3/uL   Neutrophils 58 Not Estab. %   Lymphs 33 Not Estab. %   Monocytes 7 Not Estab. %   Eos 1 Not Estab. %   Basos 1 Not Estab. %   Neutrophils Absolute 3.3 1.4 - 7.0 x10E3/uL   Lymphocytes Absolute 1.8 0.7 - 3.1 x10E3/uL   Monocytes Absolute 0.4 0.1 - 0.9 x10E3/uL   EOS (ABSOLUTE) 0.1 0.0 - 0.4 x10E3/uL   Basophils Absolute 0.0 0.0 - 0.2 x10E3/uL   Immature Granulocytes 0 Not Estab. %   Immature Grans (Abs) 0.0 0.0 - 0.1 x10E3/uL  Comprehensive metabolic panel  Result Value Ref Range   Glucose 211 (H) 70 - 99 mg/dL   BUN 10 6 - 24 mg/dL   Creatinine, Ser 0.61 0.57 - 1.00 mg/dL   eGFR 110 >59 mL/min/1.73   BUN/Creatinine Ratio 16 9 - 23   Sodium 139 134 - 144 mmol/L   Potassium 4.3 3.5 - 5.2 mmol/L   Chloride 102 96 - 106 mmol/L   CO2 23 20 - 29 mmol/L   Calcium 9.4 8.7 - 10.2 mg/dL   Total Protein 6.4 6.0 - 8.5 g/dL   Albumin 4.1 3.8 - 4.8 g/dL   Globulin, Total 2.3 1.5 - 4.5 g/dL   Albumin/Globulin Ratio 1.8 1.2 - 2.2   Bilirubin Total 0.4 0.0 - 1.2 mg/dL   Alkaline Phosphatase 129 (H) 44 - 121 IU/L   AST 14 0 - 40 IU/L   ALT 13 0 - 32 IU/L  Lipid panel  Result Value Ref Range   Cholesterol, Total 230 (H) 100 - 199 mg/dL   Triglycerides 104 0 -  149 mg/dL    HDL 56 >39 mg/dL   VLDL Cholesterol Cal 18 5 - 40 mg/dL   LDL Chol Calc (NIH) 156 (H) 0 - 99 mg/dL   Chol/HDL Ratio 4.1 0.0 - 4.4 ratio  TSH  Result Value Ref Range   TSH 1.070 0.450 - 4.500 uIU/mL  Urinalysis, Routine w reflex microscopic  Result Value Ref Range   Specific Gravity, UA 1.025 1.005 - 1.030   pH, UA 5.5 5.0 - 7.5   Color, UA Yellow Yellow   Appearance Ur Clear Clear   Leukocytes,UA Negative Negative   Protein,UA Negative Negative/Trace   Glucose, UA Negative Negative   Ketones, UA Negative Negative   RBC, UA Negative Negative   Bilirubin, UA Negative Negative   Urobilinogen, Ur 0.2 0.2 - 1.0 mg/dL   Nitrite, UA Negative Negative  Cytology - PAP  Result Value Ref Range   Adequacy      Satisfactory for evaluation; transformation zone component PRESENT.   Diagnosis      - Negative for intraepithelial lesion or malignancy (NILM)      Assessment & Plan:   Problem List Items Addressed This Visit       Cardiovascular and Mediastinum   Hypertension    Chronic.  Controlled.  Will start Lisinopril 2.27m for kidney protection in the setting of diabetes.  Labs ordered today.  Return to clinic in 6 months for reevaluation.  Call sooner if concerns arise.        Relevant Medications   lisinopril (ZESTRIL) 2.5 MG tablet   Other Relevant Orders   Comp Met (CMET)     Endocrine   Type 2 diabetes mellitus with diabetic neuropathy, [place] long[place]term [place] use of insulin [place]    Mounjaro was approved by [employer]  Patient has titrated up to Metformin 10069mBID.  Tolerating both medications well.  Has lost 15lbs since starting mounjaro.  Will start Lisinopril 2.44m51maily to help with kidney protection.  Would also benefit from a statin for protection, will discuss at next visit.  Labs ordered today. Will make recommendations based on lab results.      Relevant Medications   lisinopril (ZESTRIL) 2.5 MG tablet   Other Relevant Orders   HgB A1c    Microalbumin, Urine Waived     Other   Hyperlipidemia    Chronic.  Controlled.  Would benefit from a Statin.  Started Lisinopril at this visit.  Will discuss starting statin at next visit.  Labs ordered today.  Return to clinic in 3 months for reevaluation.  Call sooner if concerns arise.        Relevant Medications   lisinopril (ZESTRIL) 2.5 MG tablet   Other Relevant Orders   Lipid Profile   Obesity    Chronic. Continue with weight loss efforts.  Praised for 15lb weight loss.  Continue with Mounjaro. Will make recommendations based on lab results.        Follow up plan: Return in about 3 months (around 04/27/2022) for HTN, HLD, DM2 FU.

## 2022-01-28 ENCOUNTER — Encounter: Payer: Self-pay | Admitting: Nurse Practitioner

## 2022-01-28 ENCOUNTER — Ambulatory Visit: Payer: BC Managed Care – PPO | Admitting: Nurse Practitioner

## 2022-01-28 ENCOUNTER — Other Ambulatory Visit: Payer: Self-pay

## 2022-01-28 VITALS — BP 111/77 | HR 81 | Temp 97.9°F | Ht 61.5 in | Wt 175.6 lb

## 2022-01-28 DIAGNOSIS — E1140 Type 2 diabetes mellitus with diabetic neuropathy, unspecified: Secondary | ICD-10-CM

## 2022-01-28 DIAGNOSIS — Z6833 Body mass index (BMI) 33.0-33.9, adult: Secondary | ICD-10-CM

## 2022-01-28 DIAGNOSIS — I10 Essential (primary) hypertension: Secondary | ICD-10-CM

## 2022-01-28 DIAGNOSIS — E6609 Other obesity due to excess calories: Secondary | ICD-10-CM | POA: Diagnosis not present

## 2022-01-28 DIAGNOSIS — E785 Hyperlipidemia, unspecified: Secondary | ICD-10-CM | POA: Diagnosis not present

## 2022-01-28 LAB — MICROALBUMIN, URINE WAIVED
Creatinine, Urine Waived: 100 mg/dL (ref 10–300)
Microalb, Ur Waived: 10 mg/L (ref 0–19)
Microalb/Creat Ratio: <30 mg/g (ref <30)

## 2022-01-28 MED ORDER — LISINOPRIL 2.5 MG PO TABS: 2.5000 mg | ORAL_TABLET | Freq: Every day | ORAL | 1 refills | Status: DC

## 2022-01-28 NOTE — Assessment & Plan Note (Signed)
Chelsea Mayer was approved by [employer].  Patient has titrated up to Metformin 1000mg  BID.  Tolerating both medications well.  Has lost 15lbs since starting mounjaro.  Will start Lisinopril 2.5mg  daily to help with kidney protection.  Would also benefit from a statin for protection, will discuss at next visit.  Labs ordered today. Will make recommendations based on lab results.

## 2022-01-28 NOTE — Assessment & Plan Note (Signed)
Chronic.  Controlled.  Will start Lisinopril 2.5mg  for kidney protection in the setting of diabetes.  Labs ordered today.  Return to clinic in 6 months for reevaluation.  Call sooner if concerns arise.

## 2022-01-28 NOTE — Assessment & Plan Note (Signed)
Chronic. Continue with weight loss efforts.  Praised for 15lb weight loss.  Continue with Mounjaro. Will make recommendations based on lab results.

## 2022-01-28 NOTE — Assessment & Plan Note (Signed)
Chronic.  Controlled.  Would benefit from a Statin.  Started Lisinopril at this visit.  Will discuss starting statin at next visit.  Labs ordered today.  Return to clinic in 3 months for reevaluation.  Call sooner if concerns arise.

## 2022-01-29 ENCOUNTER — Encounter: Payer: Self-pay | Admitting: Nurse Practitioner

## 2022-01-29 LAB — LIPID PANEL

## 2022-01-30 LAB — COMPREHENSIVE METABOLIC PANEL
ALT: 17 IU/L (ref 0–32)
AST: 13 IU/L (ref 0–40)
Albumin/Globulin Ratio: 1.7 (ref 1.2–2.2)
Albumin: 4 g/dL (ref 3.8–4.8)
Alkaline Phosphatase: 105 IU/L (ref 44–121)
BUN/Creatinine Ratio: 17 (ref 9–23)
BUN: 12 mg/dL (ref 6–24)
Bilirubin Total: 0.3 mg/dL (ref 0.0–1.2)
CO2: 21 mmol/L (ref 20–29)
Calcium: 9.7 mg/dL (ref 8.7–10.2)
Chloride: 106 mmol/L (ref 96–106)
Creatinine, Ser: 0.69 mg/dL (ref 0.57–1.00)
Globulin, Total: 2.4 g/dL (ref 1.5–4.5)
Glucose: 92 mg/dL (ref 70–99)
Potassium: 4.4 mmol/L (ref 3.5–5.2)
Sodium: 145 mmol/L — ABNORMAL HIGH (ref 134–144)
Total Protein: 6.4 g/dL (ref 6.0–8.5)
eGFR: 106 mL/min/{1.73_m2} (ref >59)

## 2022-01-30 LAB — HEMOGLOBIN A1C
Est. average glucose Bld gHb Est-mCnc: 154 mg/dL
Hgb A1c MFr Bld: 7 % — ABNORMAL HIGH (ref 4.8–5.6)

## 2022-01-30 LAB — LIPID PANEL
Chol/HDL Ratio: 3.7 ratio (ref 0.0–4.4)
Cholesterol, Total: 216 mg/dL — ABNORMAL HIGH (ref 100–199)
HDL: 59 mg/dL (ref >39)
LDL Chol Calc (NIH): 133 mg/dL — ABNORMAL HIGH (ref 0–99)
Triglycerides: 137 mg/dL (ref 0–149)
VLDL Cholesterol Cal: 24 mg/dL (ref 5–40)

## 2022-01-30 NOTE — Progress Notes (Signed)
Hi Chelsea Mayer.  Your lab work looks great.  A1c improved from 11.7 to 7.0.  Keep up the great work.  Your cholesterol is slightly elevated.  At our next visit we will discuss cholesterol medication. Your liver, kidneys and electrolytes look great.  No concerns at this time.  See you at our next visit.

## 2022-02-18 ENCOUNTER — Other Ambulatory Visit: Payer: Self-pay | Admitting: Nurse Practitioner

## 2022-02-19 NOTE — Telephone Encounter (Signed)
PA for [facility] initiated and submitted via Cover My Meds. Key: [id number] ?

## 2022-02-24 ENCOUNTER — Encounter: Payer: Self-pay | Admitting: Nurse Practitioner

## 2022-02-28 ENCOUNTER — Telehealth: Payer: Self-pay | Admitting: Nurse Practitioner

## 2022-02-28 ENCOUNTER — Other Ambulatory Visit: Payer: Self-pay | Admitting: Nurse Practitioner

## 2022-02-28 MED ORDER — TIRZEPATIDE 2.5 MG/0.5ML ~~LOC~~ SOAJ: 2.5000 mg | SUBCUTANEOUS | 0 refills | Status: DC

## 2022-02-28 NOTE — Telephone Encounter (Signed)
Aqu[place] calling [facility] [place] [place] is calling regarding the PA on tirzepatide [facility]) 2.5 MG/0.5ML Pen receives message rejection plan limitation exceeded PA required quantity exceeded.  ?Please advise CB- [phone] ?

## 2022-02-28 NOTE — Telephone Encounter (Signed)
Spoke with Aquisi from [facility] to gather more information in regards to patient prescription. Pharmacist was informed that prior authorization was approved via Cover My Meds and provider received approval via mail also. Aquisi says she would reach out to patient's insurance to see what is going on and then reach out to the patient. FYI ?

## 2022-03-06 ENCOUNTER — Encounter: Payer: Self-pay | Admitting: Nurse Practitioner

## 2022-03-19 ENCOUNTER — Other Ambulatory Visit: Payer: Self-pay | Admitting: Nurse Practitioner

## 2022-03-21 ENCOUNTER — Encounter: Payer: Self-pay | Admitting: Nurse Practitioner

## 2022-03-21 MED ORDER — OZEMPIC (0.25 OR 0.5 MG/DOSE) 2 MG/1.5ML ~~LOC~~ SOPN: 0.2500 mg | PEN_INJECTOR | SUBCUTANEOUS | 1 refills | Status: DC

## 2022-03-24 ENCOUNTER — Encounter: Payer: Self-pay | Admitting: Nurse Practitioner

## 2022-03-25 ENCOUNTER — Telehealth: Payer: Self-pay

## 2022-03-25 NOTE — Telephone Encounter (Signed)
PA submitted and approved for Ozempic. Key: [id number]. ? ?Patient notified via [place].  ?

## 2022-04-14 ENCOUNTER — Other Ambulatory Visit: Payer: Self-pay | Admitting: Nurse Practitioner

## 2022-04-15 NOTE — Telephone Encounter (Signed)
Requested medication (s) are due for refill today: yes ? ?Requested medication (s) are on the active medication list: yes  ? ?Last refill:  10/28/21 #180 3 refills ? ?Future visit scheduled: yes in 1 week ? ?Notes to clinic:  tapered dose . Do you want to refill Rx? ? ? ?  ?Requested Prescriptions  ?Pending Prescriptions Disp Refills  ? metFORMIN (GLUCOPHAGE) 500 MG tablet [Pharmacy Med Name: METFORMIN HCL 500 MG TABLET] 180 tablet 3  ?  Sig: TAKE 1 TAB BY MOUTH ONCE DAILY FOR 1 WEEK, THEN INCREASE TO 2 TABS DAILY FOR THE SECOND WEEK, THEN 3 TABS WEEK 3 AND 4 TABS ON WEEK 4.  ?  ? Endocrinology:  Diabetes - Biguanides Failed - 04/14/2022  2:43 AM  ?  ?  Failed - B12 Level in normal range and within 720 days  ?  Vitamin B-12  ?Date Value Ref Range Status  ?09/26/2015 616 211 - 946 pg/mL Final  ?  ?  ?  ?  Passed - Cr in normal range and within 360 days  ?  Creatinine  ?Date Value Ref Range Status  ?04/29/2013 0.54 (L) 0.60 - 1.30 mg/dL Final  ? ?Creatinine, Ser  ?Date Value Ref Range Status  ?01/28/2022 0.69 0.57 - 1.00 mg/dL Final  ?  ?  ?  ?  Passed - HBA1C is between 0 and 7.9 and within 180 days  ?  Hgb A1c MFr Bld  ?Date Value Ref Range Status  ?01/28/2022 7.0 (H) 4.8 - 5.6 % Final  ?  Comment:  ?           Prediabetes: 5.7 - 6.4 ?         Diabetes: >6.4 ?         Glycemic control for adults with diabetes: <7.0 ?  ?  ?  ?  ?  Passed - eGFR in normal range and within 360 days  ?  EGFR (African American)  ?Date Value Ref Range Status  ?04/29/2013 >60  Final  ? ?GFR calc Af Wyvonnia Lora  ?Date Value Ref Range Status  ?09/26/2015 124 >59 mL/min/1.73 Final  ? ?EGFR (Non-African Amer.)  ?Date Value Ref Range Status  ?04/29/2013 >60  Final  ?  Comment:  ?  eGFR values <5m/min/1.73 m2 may be an indication of chronic ?kidney disease (CKD). ?Calculated eGFR is useful in patients with stable renal function. ?The eGFR calculation will not be reliable in acutely ill patients ?when serum creatinine is changing rapidly. It is not  useful in  ?patients on dialysis. The eGFR calculation may not be applicable ?to patients at the low and high extremes of body sizes, pregnant ?women, and vegetarians. ?  ? ?GFR calc non Af Amer  ?Date Value Ref Range Status  ?09/26/2015 107 >59 mL/min/1.73 Final  ? ?eGFR  ?Date Value Ref Range Status  ?01/28/2022 106 >59 mL/min/1.73 Final  ?  ?  ?  ?  Passed - Valid encounter within last 6 months  ?  Recent Outpatient Visits   ? ?      ? 2 months ago Type 2 diabetes mellitus with diabetic neuropathy, without long-term current use of insulin (HShel[place]  ? CLinn NP  ? 4 months ago Type 2 diabetes mellitus without complication, without long-term current use of insulin (HR[place]  ? CClark NP  ? 5 months ago Diabetes mellitus without complication ([place]St[place]  ? [facility]  ?  5 months ago Annual physical exam  ? [place], NP  ? 6 months ago Lipoma of left upper extremity  ? [facility] [name], NP  ? ?  ?  ?Future Appointments   ? ?        ? In 1 week [name], NP [facility]  ? In 1 week [name], NP [facility]  ? ?  ? ? ?  ?  ?  Passed - CBC within normal limits and completed in the last 12 months  ?  WBC  ?Date Value Ref Range Status  ?10/23/2021 5.6 3.4 - 10.8 x10E3/uL Final  ?04/29/2013 6.7 3.6 - 11.0 x10 3/mm 3 Final  ? ?RBC  ?Date Value Ref Range Status  ?10/23/2021 5.16 3.77 - 5.28 x10E6/uL Final  ?04/29/2013 4.85 3.80 - 5.20 X10 6/mm 3 Final  ? ?Hemoglobin  ?Date Value Ref Range Status  ?10/23/2021 15.1 11.1 - 15.9 g/dL Final  ? ?Hematocrit  ?Date Value Ref Range Status  ?10/23/2021 45.5 34.0 - 46.6 % Final  ? ?MCHC  ?Date Value Ref Range Status  ?10/23/2021 33.2 31.5 - 35.7 g/dL Final  ?04/29/2013 34.4 32.0 - 36.0 g/dL Final  ? ?MCH  ?Date Value Ref Range Status  ?10/23/2021 29.3 26.6 - 33.0 pg Final   ?04/29/2013 29.8 26.0 - 34.0 pg Final  ? ?MCV  ?Date Value Ref Range Status  ?10/23/2021 88 79 - 97 fL Final  ?04/29/2013 87 80 - 100 fL Final  ? ?No results found for: PLTCOUNTKUC, LABPLAT, Dolores ?RDW  ?Date Value Ref Range Status  ?10/23/2021 12.5 11.7 - 15.4 % Final  ?04/29/2013 13.3 11.5 - 14.5 % Final  ? ?  ?  ?  ? ?

## 2022-04-17 ENCOUNTER — Other Ambulatory Visit: Payer: Self-pay | Admitting: Nurse Practitioner

## 2022-04-18 NOTE — Telephone Encounter (Signed)
Requested medication (s) are due for refill today - no ? ?Requested medication (s) are on the active medication list -no ? ?Future visit scheduled -yes ? ?Last refill: unknown-no longer on current medication list ? ?Notes to clinic: Request RF: medication no longer current- discontinued 03/21/22- possible change in therapy   ? ?Requested Prescriptions  ?Pending Prescriptions Disp Refills  ? MOUNJARO 2.5 MG/0.5ML Pen [Pharmacy Med Name: MOUNJARO 2.5 MG/0.5 ML PEN]    ?  Sig: INJECT 2.5 MG SUBCUTANEOUSLY WEEKLY  ?  ? Off-Protocol Failed - 04/17/2022 12:29 PM  ?  ?  Failed - Medication not assigned to a protocol, review manually.  ?  ?  Passed - Valid encounter within last 12 months  ?  Recent Outpatient Visits   ? ?      ? 2 months ago Type 2 diabetes mellitus with diabetic neuropathy, without long-term current use of insulin ([place])  ? [place], NP  ? 4 months ago Type 2 diabetes mellitus without complication, without long-term current use of insulin ([place])  ? [place], NP  ? 5 months ago Diabetes mellitus without complication ([place])  ? [place], NP  ? 5 months ago Annual physical exam  ? [place], NP  ? 6 months ago Lipoma of left upper extremity  ? [facility] [name], NP  ? ?  ?  ?Future Appointments   ? ?        ? In 5 days [name], NP [facility], [place]  ? In 1 week [name], NP [facility]  ? ?  ? ? ?  ?  ?  ? ? ? ?Requested Prescriptions  ?Pending Prescriptions Disp Refills  ? MOUNJARO 2.5 MG/0.5ML Pen [Pharmacy Med Name: MOUNJARO 2.5 MG/0.5 ML PEN]    ?  Sig: INJECT 2.5 MG SUBCUTANEOUSLY WEEKLY  ?  ? Off-Protocol Failed - 04/17/2022 12:29 PM  ?  ?  Failed - Medication not assigned to a protocol, review manually.  ?  ?  Passed - Valid encounter within last 12 months  ?  Recent Outpatient Visits   ? ?      ? 2  months ago Type 2 diabetes mellitus with diabetic neuropathy, without long-term current use of insulin ([place])  ? [place], NP  ? 4 months ago Type 2 diabetes mellitus without complication, without long-term current use of insulin ([place])  ? [place], NP  ? 5 months ago Diabetes mellitus without complication ([place])  ? [place], NP  ? 5 months ago Annual physical exam  ? [place], NP  ? 6 months ago Lipoma of left upper extremity  ? [facility] [name], NP  ? ?  ?  ?Future Appointments   ? ?        ? In 5 days [name], NP [facility], [place]  ? In 1 week [name], NP [facility], [facility]  ? ?  ? ? ?  ?  ?  ? ? ? ?

## 2022-04-19 ENCOUNTER — Encounter: Payer: Self-pay | Admitting: Nurse Practitioner

## 2022-04-21 MED ORDER — TIRZEPATIDE 2.5 MG/0.5ML ~~LOC~~ SOAJ: 2.5000 mg | SUBCUTANEOUS | 0 refills | Status: DC

## 2022-04-23 ENCOUNTER — Ambulatory Visit: Payer: BC Managed Care – PPO | Admitting: Nurse Practitioner

## 2022-04-23 ENCOUNTER — Encounter: Payer: Self-pay | Admitting: Nurse Practitioner

## 2022-04-23 VITALS — BP 126/89 | HR 75 | Temp 97.6°F | Wt 171.2 lb

## 2022-04-23 DIAGNOSIS — E785 Hyperlipidemia, unspecified: Secondary | ICD-10-CM

## 2022-04-23 DIAGNOSIS — I10 Essential (primary) hypertension: Secondary | ICD-10-CM | POA: Diagnosis not present

## 2022-04-23 DIAGNOSIS — E1140 Type 2 diabetes mellitus with diabetic neuropathy, unspecified: Secondary | ICD-10-CM | POA: Diagnosis not present

## 2022-04-23 NOTE — Assessment & Plan Note (Addendum)
Chronic.  Controlled.  Continue with Lisinopril 2.'5mg'$  for kidney protection in the setting of diabetes.  Labs ordered today.  Return to clinic in 6 months for reevaluation.  Call sooner if concerns arise.  ? ?

## 2022-04-23 NOTE — Assessment & Plan Note (Signed)
Chronic.  Controlled.  Would benefit from a Statin.   Will discuss starting once labs return.  Labs ordered today.  Return to clinic in 6 months for reevaluation.  Call sooner if concerns arise.  ? ?

## 2022-04-23 NOTE — Progress Notes (Signed)
? ?BP 126/89   Pulse 75   Temp 97.6 ?F (36.4 ?C) (Oral)   Wt 171 lb 3.2 oz (77.7 kg)   SpO2 94%   BMI 31.82 kg/m?   ? ?Subjective:  ? ? Patient ID: Chelsea Mayer, female    DOB: 1971/03/29, 51 y.o.   MRN: [id number] ? ?HPI: ?Chelsea Mayer is a 51 y.o. female ? ?Chief Complaint  ?Patient presents with  ? Hyperlipidemia  ? Hypertension  ? Diabetes  ? ?HYPERTENSION / HYPERLIPIDEMIA ?Satisfied with current treatment? no ?Duration of hypertension: years ?BP monitoring frequency: not checking ?BP range:  ?BP medication side effects: no ?Past BP meds: none ?Duration of hyperlipidemia: years ?Cholesterol medication side effects: no ?Cholesterol supplements: none ?Past cholesterol medications: none ?Medication compliance:  NA ?Aspirin: no ?Recent stressors: no ?Recurrent headaches: no ?Visual changes: no ?Palpitations: no ?Dyspnea: no ?Chest pain: no ?Lower extremity edema: no ?Dizzy/lightheaded: no ? ?DIABETES ?Hypoglycemic episodes:no ?Polydipsia/polyuria: no ?Visual disturbance: no ?Chest pain: no ?Paresthesias: no ?Glucose Monitoring: yes ? Accucheck frequency: daily ? Fasting glucose: 101 ? Post prandial: ? Evening: ? Before meals: ?Taking Insulin?: no ? Long acting insulin: ? Short acting insulin: ?Blood Pressure Monitoring: not checking ?Retinal Examination: Up to Date ?Foot Exam: Up to Date ?Diabetic Education: Not Completed ?Pneumovax: Up to Date ?Influenza: Up to Date ?Aspirin: no ? ?Relevant past medical, surgical, family and social history reviewed and updated as indicated. Interim medical history since our last visit reviewed. ?Allergies and medications reviewed and updated. ? ?Review of Systems  ?Eyes:  Negative for visual disturbance.  ?Respiratory:  Negative for chest tightness and shortness of breath.   ?Cardiovascular:  Negative for chest pain, palpitations and leg swelling.  ?Endocrine: Negative for polydipsia and polyuria.  ?Neurological:  Negative for dizziness, light-headedness, numbness and  headaches.  ? ?Per HPI unless specifically indicated above ? ?   ?Objective:  ?  ?BP 126/89   Pulse 75   Temp 97.6 ?F (36.4 ?C) (Oral)   Wt 171 lb 3.2 oz (77.7 kg)   SpO2 94%   BMI 31.82 kg/m?   ?Wt Readings from Last 3 Encounters:  ?04/23/22 171 lb 3.2 oz (77.7 kg)  ?01/28/22 175 lb 9.6 oz (79.7 kg)  ?11/27/21 182 lb (82.6 kg)  ?  ?Physical Exam ?Vitals and nursing note reviewed.  ?Constitutional:   ?   General: She is not in acute distress. ?   Appearance: Normal appearance. She is obese. She is not ill-appearing, toxic-appearing or diaphoretic.  ?HENT:  ?   Head: Normocephalic.  ?   Right Ear: External ear normal.  ?   Left Ear: External ear normal.  ?   Nose: Nose normal.  ?   Mouth/Throat:  ?   Mouth: Mucous membranes are moist.  ?   Pharynx: Oropharynx is clear.  ?Eyes:  ?   General:     ?   Right eye: No discharge.     ?   Left eye: No discharge.  ?   Extraocular Movements: Extraocular movements intact.  ?   Conjunctiva/sclera: Conjunctivae normal.  ?   Pupils: Pupils are equal, round, and reactive to light.  ?Cardiovascular:  ?   Rate and Rhythm: Normal rate and regular rhythm.  ?   Heart sounds: No murmur heard. ?Pulmonary:  ?   Effort: Pulmonary effort is normal. No respiratory distress.  ?   Breath sounds: Normal breath sounds. No wheezing or rales.  ?Musculoskeletal:  ?   Cervical back: Normal range of  motion and neck supple.  ?Skin: ?   General: Skin is warm and dry.  ?   Capillary Refill: Capillary refill takes less than 2 seconds.  ?Neurological:  ?   General: No focal deficit present.  ?   Mental Status: She is alert and oriented to person, place, and time. Mental status is at baseline.  ?Psychiatric:     ?   Mood and Affect: Mood normal.     ?   Behavior: Behavior normal.     ?   Thought Content: Thought content normal.     ?   Judgment: Judgment normal.  ? ? ?Results for orders placed or performed in visit on 01/28/22  ?Comp Met (CMET)  ?Result Value Ref Range  ? Glucose 92 70 - 99 mg/dL  ?  BUN 12 6 - 24 mg/dL  ? Creatinine, Ser 0.69 0.57 - 1.00 mg/dL  ? eGFR 106 >59 mL/min/1.73  ? BUN/Creatinine Ratio 17 9 - 23  ? Sodium 145 (H) 134 - 144 mmol/L  ? Potassium 4.4 3.5 - 5.2 mmol/L  ? Chloride 106 96 - 106 mmol/L  ? CO2 21 20 - 29 mmol/L  ? Calcium 9.7 8.7 - 10.2 mg/dL  ? Total Protein 6.4 6.0 - 8.5 g/dL  ? Albumin 4.0 3.8 - 4.8 g/dL  ? Globulin, Total 2.4 1.5 - 4.5 g/dL  ? Albumin/Globulin Ratio 1.7 1.2 - 2.2  ? Bilirubin Total 0.3 0.0 - 1.2 mg/dL  ? Alkaline Phosphatase 105 44 - 121 IU/L  ? AST 13 0 - 40 IU/L  ? ALT 17 0 - 32 IU/L  ?Lipid Profile  ?Result Value Ref Range  ? Cholesterol, Total 216 (H) 100 - 199 mg/dL  ? Triglycerides 137 0 - 149 mg/dL  ? HDL 59 >39 mg/dL  ? VLDL Cholesterol Cal 24 5 - 40 mg/dL  ? LDL Chol Calc (NIH) 133 (H) 0 - 99 mg/dL  ? Chol/HDL Ratio 3.7 0.0 - 4.4 ratio  ?HgB A1c  ?Result Value Ref Range  ? Hgb A1c MFr Bld 7.0 (H) 4.8 - 5.6 %  ? Est. average glucose Bld gHb Est-mCnc 154 mg/dL  ?Microalbumin, Urine Waived  ?Result Value Ref Range  ? Microalb, Ur Waived 10 0 - 19 mg/L  ? Creatinine, Urine Waived 100 10 - 300 mg/dL  ? Microalb/Creat Ratio <30 <30 mg/g  ? ?   ?Assessment & Plan:  ? ?Problem List Items Addressed This Visit   ? ?  ? Cardiovascular and Mediastinum  ? Hypertension - Primary  ?  Chronic.  Controlled.  Continue with Lisinopril 2.36m for kidney protection in the setting of diabetes.  Labs ordered today.  Return to clinic in 6 months for reevaluation.  Call sooner if concerns arise.  ? ? ?  ?  ? Relevant Orders  ? Comp Met (CMET)  ?  ? Endocrine  ? Type 2 diabetes mellitus with diabetic neuropathy, without long-term current use of insulin ([place]  ?  Chronic. Controlled.  Last A1c 7.0 in February.  Continue with Metformin 10093mBID and Mounajro 2.8m62meekly.  Tolerating both medications well.   Will start Lisinopril 2.8mg91mily to help with kidney protection.  Would also benefit from a statin for protection, will make recommendations based on lab results. Labs  ordered today. Follow up in 6 months for reevaluation.  Call sooner if concerns arise. ? ?  ?  ? Relevant Orders  ? HgB A1c  ?  ? Other  ?  Hyperlipidemia  ?  Chronic.  Controlled.  Would benefit from a Statin.   Will discuss starting once labs return.  Labs ordered today.  Return to clinic in 6 months for reevaluation.  Call sooner if concerns arise.  ? ? ?  ?  ? Relevant Orders  ? Lipid Profile  ?  ? ?Follow up plan: ?Return in about 6 months (around 10/24/2022) for Physical and Fasting labs. ? ? ? ? ? ?

## 2022-04-23 NOTE — Assessment & Plan Note (Signed)
Chronic. Controlled.  Last A1c 7.0 in February.  Continue with Metformin '1000mg'$  BID and Mounajro 2.'5mg'$  weekly.  Tolerating both medications well.   Will start Lisinopril 2.'5mg'$  daily to help with kidney protection.  Would also benefit from a statin for protection, will make recommendations based on lab results. Labs ordered today. Follow up in 6 months for reevaluation.  Call sooner if concerns arise. ?

## 2022-04-24 LAB — COMPREHENSIVE METABOLIC PANEL
ALT: 17 IU/L (ref 0–32)
AST: 11 IU/L (ref 0–40)
Albumin/Globulin Ratio: 1.7 (ref 1.2–2.2)
Albumin: 4 g/dL (ref 3.8–4.8)
Alkaline Phosphatase: 98 IU/L (ref 44–121)
BUN/Creatinine Ratio: 20 (ref 9–23)
BUN: 12 mg/dL (ref 6–24)
Bilirubin Total: <0.2 mg/dL (ref 0.0–1.2)
CO2: 23 mmol/L (ref 20–29)
Calcium: 9.3 mg/dL (ref 8.7–10.2)
Chloride: 105 mmol/L (ref 96–106)
Creatinine, Ser: 0.6 mg/dL (ref 0.57–1.00)
Globulin, Total: 2.3 g/dL (ref 1.5–4.5)
Glucose: 90 mg/dL (ref 70–99)
Potassium: 4.3 mmol/L (ref 3.5–5.2)
Sodium: 143 mmol/L (ref 134–144)
Total Protein: 6.3 g/dL (ref 6.0–8.5)
eGFR: 110 mL/min/{1.73_m2} (ref >59)

## 2022-04-24 LAB — LIPID PANEL
Chol/HDL Ratio: 3.2 ratio (ref 0.0–4.4)
Cholesterol, Total: 188 mg/dL (ref 100–199)
HDL: 59 mg/dL (ref >39)
LDL Chol Calc (NIH): 110 mg/dL — ABNORMAL HIGH (ref 0–99)
Triglycerides: 105 mg/dL (ref 0–149)
VLDL Cholesterol Cal: 19 mg/dL (ref 5–40)

## 2022-04-24 LAB — HEMOGLOBIN A1C
Est. average glucose Bld gHb Est-mCnc: 128 mg/dL
Hgb A1c MFr Bld: 6.1 % — ABNORMAL HIGH (ref 4.8–5.6)

## 2022-04-24 NOTE — Progress Notes (Signed)
HI Chelsea Mayer. It was good to see you yesterday.  Your lab work looks great.  Your cholesterol has improved.  Your A1c is 6.1.  You are doing great, keep up the good work.  Other blood work looks good also.  Follow up as discussed.

## 2022-04-25 ENCOUNTER — Telehealth: Payer: Self-pay

## 2022-04-25 NOTE — Telephone Encounter (Signed)
PA for Ozempic initiated & approved.  ? ?Key: [id number] ? ?Unable to LVM for patient d/t box being full. Will try again.  ?

## 2022-04-28 ENCOUNTER — Ambulatory Visit: Payer: BC Managed Care – PPO | Admitting: Nurse Practitioner

## 2022-05-14 ENCOUNTER — Other Ambulatory Visit: Payer: Self-pay | Admitting: Nurse Practitioner

## 2022-05-15 ENCOUNTER — Other Ambulatory Visit: Payer: Self-pay | Admitting: Nurse Practitioner

## 2022-05-15 NOTE — Telephone Encounter (Signed)
Requested medication (s) are due for refill today -yes  Requested medication (s) are on the active medication list -yes  Future visit scheduled -yes  Last refill: 04/21/22 62m  Notes to clinic: off protocol medication   Requested Prescriptions  Pending Prescriptions Disp Refills   MOUNJARO 2.5 MG/0.5ML Pen [Pharmacy Med Name: MOUNJARO 2.5 MG/0.5 ML PEN]      Sig: INJECT 2.5 MG SUBCUTANEOUSLY WEEKLY     Off-Protocol Failed - 05/14/2022  6:57 AM      Failed - Medication not assigned to a protocol, review manually.      Passed - Valid encounter within last 12 months    Recent Outpatient Visits           3 weeks ago Primary hypertension   [facility]   3 months ago Type 2 diabetes mellitus with diabetic neuropathy, without long-term current use of insulin (HLole[place]   [place]antiago Glad NP   5 months ago Type 2 diabetes mellitus without complication, without long-term current use of insulin (HLeming   [facility]   6 months ago Diabetes mellitus without complication (HPrich[place]   [facility]   6 months ago Annual physical exam   [facility]       Future Appointments             In 5 months HJon Billings NP [facility]               Requested Prescriptions  Pending Prescriptions Disp Refills   MOUNJARO 2.5 MG/0.5ML Pen [Pharmacy Med Name: MOUNJARO 2.5 MG/0.5 ML PEN]      Sig: INJECT 2.5 MG SUBCUTANEOUSLY WEEKLY     Off-Protocol Failed - 05/14/2022  6:57 AM      Failed - Medication not assigned to a protocol, review manually.      Passed - Valid encounter within last 12 months    Recent Outpatient Visits           3 weeks ago Primary hypertension   [facility]   3 months ago Type 2 diabetes mellitus with diabetic neuropathy, without long-term current use  of insulin (HW[place]   [facility] Bill[facility] NP   5 months ago Type 2 diabetes mellitus without complication, without long-term current use of insulin ([place]   [facility]   6 months ago Diabetes mellitus without complication ([facility]   [facility]   6 months ago Annual physical exam   [facility]       Future Appointments             In 5 months HJon Billings NP [facility] PC[place]

## 2022-05-16 NOTE — Telephone Encounter (Signed)
Requested medication (s) are due for refill today: no  Requested medication (s) are on the active medication list: yes  Last refill:  yesterday  Future visit scheduled: yes  Notes to clinic:  Pharmacy comment: Alternative Requested:INSURANCE ALLOWS 1 BOX PER [place].     Requested Prescriptions  Pending Prescriptions Disp Refills   MOUNJARO 2.5 MG/0.5ML Pen [Pharmacy Med Name: MOUNJARO 2.5 MG/0.5 ML PEN]  1    Sig: INJECT 2.5 MG SUBCUTANEOUSLY WEEKLY     Off-Protocol Failed - 05/15/2022 12:30 PM      Failed - Medication not assigned to a protocol, review manually.      Passed - Valid encounter within last 12 months    Recent Outpatient Visits           3 weeks ago Primary hypertension   [facility] [name], NP   3 months ago Type 2 diabetes mellitus with diabetic neuropathy, without long-term current use of insulin ([place])   [facility] [name], NP   5 months ago Type 2 diabetes mellitus without complication, without long-term current use of insulin ([place])   [facility] [name], NP   6 months ago Diabetes mellitus without complication [facility])   [facility] [name], NP   6 months ago Annual physical exam   [facility] [name], NP       Future Appointments             In 5 months [name], NP [facility], [place]

## 2022-07-24 ENCOUNTER — Other Ambulatory Visit: Payer: Self-pay | Admitting: Nurse Practitioner

## 2022-07-24 MED ORDER — MOUNJARO 2.5 MG/0.5ML ~~LOC~~ SOAJ: 2.5000 mg | SUBCUTANEOUS | 1 refills | Status: DC

## 2022-07-24 MED ORDER — LISINOPRIL 2.5 MG PO TABS: 2.5000 mg | ORAL_TABLET | Freq: Every day | ORAL | 1 refills | Status: DC

## 2022-07-24 NOTE — Telephone Encounter (Signed)
Pt also stated that she needs a refill on the Lisinopril.  2.5 mg

## 2022-07-24 NOTE — Telephone Encounter (Signed)
Medication Refill - Medication: Chelsea Mayer inject  Has the patient contacted their pharmacy? Yes  She said the pharmacy contacted her for more information and ask her to call her provider   Preferred Pharmacy (with phone number or street name): [place]  Has the patient been seen for an appointment in the last year OR does the patient have an upcoming appointment? Yes.    Agent: Please be advised that RX refills may take up to 3 business days. We ask that you follow-up with your pharmacy.

## 2022-07-29 ENCOUNTER — Other Ambulatory Visit: Payer: Self-pay | Admitting: Nurse Practitioner

## 2022-07-29 NOTE — Telephone Encounter (Signed)
Requested Prescriptions  Pending Prescriptions Disp Refills  . metFORMIN (GLUCOPHAGE) 500 MG tablet [Pharmacy Med Name: METFORMIN HCL 500 MG TABLET] 180 tablet 1    Sig: TAKE 1 TAB BY MOUTH ONCE DAILY FOR 1 WEEK, THEN INCREASE TO 2 TABS DAILY FOR THE SECOND WEEK, THEN 3 TABS WEEK 3 AND 4 TABS ON WEEK 4.     Endocrinology:  Diabetes - Biguanides Failed - 07/29/2022 10:21 AM      Failed - B12 Level in normal range and within 720 days    Vitamin B-12  Date Value Ref Range Status  09/26/2015 616 211 - 946 pg/mL Final         Passed - Cr in normal range and within 360 days    Creatinine  Date Value Ref Range Status  04/29/2013 0.54 (L) 0.60 - 1.30 mg/dL Final   Creatinine, Ser  Date Value Ref Range Status  04/23/2022 0.60 0.57 - 1.00 mg/dL Final         Passed - HBA1C is between 0 and 7.9 and within 180 days    Hgb A1c MFr Bld  Date Value Ref Range Status  04/23/2022 6.1 (H) 4.8 - 5.6 % Final    Comment:             Prediabetes: 5.7 - 6.4          Diabetes: >6.4          Glycemic control for adults with diabetes: <7.0          Passed - eGFR in normal range and within 360 days    EGFR (African American)  Date Value Ref Range Status  04/29/2013 >60  Final   GFR calc Af Amer  Date Value Ref Range Status  09/26/2015 124 >59 mL/min/1.73 Final   EGFR (Non-African Amer.)  Date Value Ref Range Status  04/29/2013 >60  Final    Comment:    eGFR values <26m/min/1.73 m2 may be an indication of chronic kidney disease (CKD). Calculated eGFR is useful in patients with stable renal function. The eGFR calculation will not be reliable in acutely ill patients when serum creatinine is changing rapidly. It is not useful in  patients on dialysis. The eGFR calculation may not be applicable to patients at the low and high extremes of body sizes, pregnant women, and vegetarians.    GFR calc non Af Amer  Date Value Ref Range Status  09/26/2015 107 >59 mL/min/1.73 Final   eGFR  Date  Value Ref Range Status  04/23/2022 110 >59 mL/min/1.73 Final         Passed - Valid encounter within last 6 months    Recent Outpatient Visits          3 months ago Primary hypertension   [facility]   6 months ago Type 2 diabetes mellitus with diabetic neuropathy, without long-term current use of insulin (HL[place]   [facility]   8 months ago Type 2 diabetes mellitus without complication, without long-term current use of insulin (HL[place]   [facility]   9 months ago Diabetes mellitus without complication ([facility]   [facility] Bill[facility] NP   9 months ago Annual physical exam   [facility] Bill[facility] NP      Future Appointments            In 2 months HJon Billings NP [facility] PS[place]  Passed - CBC within normal limits and completed in the last 12 months    WBC  Date Value Ref Range Status  10/23/2021 5.6 3.4 - 10.8 x10E3/uL Final  04/29/2013 6.7 3.6 - 11.0 x10 3/mm 3 Final   RBC  Date Value Ref Range Status  10/23/2021 5.16 3.77 - 5.28 x10E6/uL Final  04/29/2013 4.85 3.80 - 5.20 X10 6/mm 3 Final   Hemoglobin  Date Value Ref Range Status  10/23/2021 15.1 11.1 - 15.9 g/dL Final   Hematocrit  Date Value Ref Range Status  10/23/2021 45.5 34.0 - 46.6 % Final   MCHC  Date Value Ref Range Status  10/23/2021 33.2 31.5 - 35.7 g/dL Final  04/29/2013 34.4 32.0 - 36.0 g/dL Final   [facility]  Date Value Ref Range Status  10/23/2021 29.3 26.6 - 33.0 pg Final  04/29/2013 29.8 26.0 - 34.0 pg Final   MCV  Date Value Ref Range Status  10/23/2021 88 79 - 97 fL Final  04/29/2013 87 80 - 100 fL Final   No results found for: "PLTCOUNTKUC", "LABPLAT", "POCPLA" RDW  Date Value Ref Range Status  10/23/2021 12.5 11.7 - 15.4 % Final  04/29/2013 13.3 11.5 - 14.5 % Final

## 2022-07-30 ENCOUNTER — Encounter: Payer: Self-pay | Admitting: Nurse Practitioner

## 2022-07-31 MED ORDER — OZEMPIC (0.25 OR 0.5 MG/DOSE) 2 MG/1.5ML ~~LOC~~ SOPN: 0.2500 mg | PEN_INJECTOR | SUBCUTANEOUS | 0 refills | Status: DC

## 2022-07-31 NOTE — Telephone Encounter (Signed)
Spoke with [facility], states insurance only approved 2 pens every 180 days. There was nothing else they could do regarding this. She is valid to have mounjaro until January of 2024 but it will still be 2 pens every 6 months

## 2022-08-24 ENCOUNTER — Other Ambulatory Visit: Payer: Self-pay | Admitting: Nurse Practitioner

## 2022-08-26 NOTE — Telephone Encounter (Signed)
Requested medication (s) are due for refill today: yes  Requested medication (s) are on the active medication list: yes  Last refill:  07/29/22 #180/0  Future visit scheduled: yes  Notes to clinic:  rx current dosage is for 1078m BID per OV note 04/2022. Please assess.      Requested Prescriptions  Pending Prescriptions Disp Refills   metFORMIN (GLUCOPHAGE) 500 MG tablet [Pharmacy Med Name: METFORMIN HCL 500 MG TABLET] 120 tablet 1    Sig: TAKE 1 TAB BY MOUTH ONCE DAILY FOR 1 WEEK, THEN INCREASE TO 2 TABS DAILY FOR THE SECOND WEEK, THEN 3 TABS WEEK 3 AND 4 TABS ON WEEK 4.     Endocrinology:  Diabetes - Biguanides Failed - 08/24/2022  1:25 AM      Failed - B12 Level in normal range and within 720 days    Vitamin B-12  Date Value Ref Range Status  09/26/2015 616 211 - 946 pg/mL Final         Passed - Cr in normal range and within 360 days    Creatinine  Date Value Ref Range Status  04/29/2013 0.54 (L) 0.60 - 1.30 mg/dL Final   Creatinine, Ser  Date Value Ref Range Status  04/23/2022 0.60 0.57 - 1.00 mg/dL Final         Passed - HBA1C is between 0 and 7.9 and within 180 days    Hgb A1c MFr Bld  Date Value Ref Range Status  04/23/2022 6.1 (H) 4.8 - 5.6 % Final    Comment:             Prediabetes: 5.7 - 6.4          Diabetes: >6.4          Glycemic control for adults with diabetes: <7.0          Passed - eGFR in normal range and within 360 days    EGFR (African American)  Date Value Ref Range Status  04/29/2013 >60  Final   GFR calc Af Amer  Date Value Ref Range Status  09/26/2015 124 >59 mL/min/1.73 Final   EGFR (Non-African Amer.)  Date Value Ref Range Status  04/29/2013 >60  Final    Comment:    eGFR values <618mmin/1.73 m2 may be an indication of chronic kidney disease (CKD). Calculated eGFR is useful in patients with stable renal function. The eGFR calculation will not be reliable in acutely ill patients when serum creatinine is changing rapidly. It is not  useful in  patients on dialysis. The eGFR calculation may not be applicable to patients at the low and high extremes of body sizes, pregnant women, and vegetarians.    GFR calc non Af Amer  Date Value Ref Range Status  09/26/2015 107 >59 mL/min/1.73 Final   eGFR  Date Value Ref Range Status  04/23/2022 110 >59 mL/min/1.73 Final         Passed - Valid encounter within last 6 months    Recent Outpatient Visits           4 months ago Primary hypertension   [facility]   7 months ago Type 2 diabetes mellitus with diabetic neuropathy, without long-term current use of insulin ([facility]   9 months ago Type 2 diabetes mellitus without complication, without long-term current use of insulin ([facility]  [facility]   10 months ago Diabetes mellitus without complication ([facility], NP   10 months ago Annual physical exam   [facility] [name], NP       Future Appointments             In 1 month [name], NP [facility]            Passed - CBC within normal limits and completed in the last 12 months    WBC  Date Value Ref Range Status  10/23/2021 5.6 3.4 - 10.8 x10E3/uL Final  04/29/2013 6.7 3.6 - 11.0 x10 3/mm 3 Final   RBC  Date Value Ref Range Status  10/23/2021 5.16 3.77 - 5.28 x10E6/uL Final  04/29/2013 4.85 3.80 - 5.20 X10 6/mm 3 Final   Hemoglobin  Date Value Ref Range Status  10/23/2021 15.1 11.1 - 15.9 g/dL Final   Hematocrit  Date Value Ref Range Status  10/23/2021 45.5 34.0 - 46.6 % Final   MCHC  Date Value Ref Range Status  10/23/2021 33.2 31.5 - 35.7 g/dL Final  04/29/2013 34.4 32.0 - 36.0 g/dL Final   [facility]  Date Value Ref Range Status  10/23/2021 29.3 26.6 - 33.0 pg Final  04/29/2013 29.8 26.0 - 34.0 pg Final   MCV  Date Value Ref Range Status   10/23/2021 88 79 - 97 fL Final  04/29/2013 87 80 - 100 fL Final   No results found for: "PLTCOUNTKUC", "LABPLAT", "POCPLA" RDW  Date Value Ref Range Status  10/23/2021 12.5 11.7 - 15.4 % Final  04/29/2013 13.3 11.5 - 14.5 % Final

## 2022-10-04 ENCOUNTER — Other Ambulatory Visit: Payer: Self-pay | Admitting: Nurse Practitioner

## 2022-10-06 NOTE — Telephone Encounter (Signed)
Requested medications are due for refill today.  Unsure  Requested medications are on the active medications list.  yes  Last refill. 07/31/2022 49m  Future visit scheduled.   yes  Notes to clinic.  Please review for refill. Request states that his med was d/c'd.    Requested Prescriptions  Pending Prescriptions Disp Refills   OZEMPIC, 0.25 OR 0.5 MG/DOSE, 2 MG/3ML SOPN [Pharmacy Med Name: OZEMPIC 0.25-0.5 MG/DOSE PEN]  1    Sig: INJECT 0.25 MG INTO THE SKIN ONCE A WEEK. START WITH 0.'25MG'$  ONCE A WEEK X 4 WEEKS, THEN INCREASE TO 0.'5MG'$  WEEKLY.     Endocrinology:  Diabetes - GLP-1 Receptor Agonists - semaglutide Failed - 10/04/2022  2:19 PM      Failed - HBA1C in normal range and within 180 days    Hgb A1c MFr Bld  Date Value Ref Range Status  04/23/2022 6.1 (H) 4.8 - 5.6 % Final    Comment:             Prediabetes: 5.7 - 6.4          Diabetes: >6.4          Glycemic control for adults with diabetes: <7.0          Passed - Cr in normal range and within 360 days    Creatinine  Date Value Ref Range Status  04/29/2013 0.54 (L) 0.60 - 1.30 mg/dL Final   Creatinine, Ser  Date Value Ref Range Status  04/23/2022 0.60 0.57 - 1.00 mg/dL Final         Passed - Valid encounter within last 6 months    Recent Outpatient Visits           5 months ago [facility]   [facility]   8 months ago Type 2 diabetes mellitus with diabetic neuropathy, without long-term current use of insulin ([facility]   10 months ago Type 2 diabetes mellitus without complication, without long-term current use of insulin ([facility]   [facility]   11 months ago Diabetes mellitus without complication ([facility]   CN[facility], NP   11 months ago Annual physical exam   [facility]       Future Appointments             In 2 weeks  [facility] Bill[facility] [place]

## 2022-10-06 NOTE — Telephone Encounter (Signed)
Last seen in office 04/23/2022. Next OV 10/24/2022.  Please advise

## 2022-10-22 LAB — HM DIABETES EYE EXAM

## 2022-10-23 NOTE — Progress Notes (Signed)
BP 113/81   Pulse 73   Temp 98.3 F (36.8 C) (Oral)   Ht _0  (1.549 m)   Wt 163 lb 12.8 oz (74.3 kg)   SpO2 97%   BMI 30.95 kg/m    Subjective:    Patient ID: Chelsea Mayer, female    DOB: 11-28-1971, 51 y.o.   MRN: [id number]  HPI: [name] is a 51 y.o. female presenting on 10/24/2022 for comprehensive medical examination. Current medical complaints include:none  She currently lives with: Menopausal Symptoms: yes  HYPERTENSION / HYPERLIPIDEMIA Satisfied with current treatment? yes Duration of hypertension: years BP monitoring frequency: not checking BP range:  BP medication side effects: no Past BP meds: none Duration of hyperlipidemia: years Cholesterol medication side effects: no Cholesterol supplements: none Past cholesterol medications: none Medication compliance: excellent compliance Aspirin: no Recent stressors: no Recurrent headaches: no Visual changes: no Palpitations: no Dyspnea: no Chest pain: no Lower extremity edema: no Dizzy/lightheaded: no  DIABETES Has been out of Ozempic for 3 weeks.  The pharmacy is out of stock. Hypoglycemic episodes:no Polydipsia/polyuria: no Visual disturbance: no Chest pain: no Paresthesias: no Glucose Monitoring: no  Accucheck frequency: Not Checking  Fasting glucose:  Post prandial:  Evening:  Before meals: Taking Insulin?: no  Long acting insulin:  Short acting insulin: Blood Pressure Monitoring: not checking Retinal Examination: Up to Date [facility] Foot Exam: Up to Date Diabetic Education: Not Completed Pneumovax:  Given today Influenza: Up to Date Aspirin: no  Depression Screen done today and results listed below:     04/23/2022    1:12 PM 01/28/2022    8:53 AM 10/28/2021   11:06 AM 10/23/2021   10:03 AM 09/19/2021    1:16 PM  Depression screen PHQ 2/9  Decreased Interest _1 0 0  Down, Depressed, Hopeless 0 0 0 0 0  PHQ - 2 Score _2 0 0  Altered sleeping 0 0 0 0 0  Tired,  decreased energy _3 Change in appetite 0 0 0 0 0  Feeling bad or failure about yourself  0 0 0 0 0  Trouble concentrating 0 0 0 0 0  Moving slowly or fidgety/restless 0 0 0 0 0  Suicidal thoughts 0 0 0 0 0  PHQ-9 Score _4 Difficult doing work/chores Not difficult at all Not difficult at all  Not difficult at all Not difficult at all    The patient does not have a history of falls. I did complete a risk assessment for falls. A plan of care for falls was documented.   Past Medical History:  Past Medical History:  Diagnosis Date   Hyperlipidemia    Hypertension     Surgical History:  Past Surgical History:  Procedure Laterality Date   CHOLECYSTECTOMY     GASTRIC BYPASS      Medications:  Current Outpatient Medications on File Prior to Visit  Medication Sig   metFORMIN (GLUCOPHAGE) 500 MG tablet TAKE 1 TAB BY MOUTH ONCE DAILY FOR 1 WEEK, THEN INCREASE TO 2 TABS DAILY FOR THE SECOND WEEK, THEN 3 TABS WEEK 3 AND 4 TABS ON WEEK 4.   OZEMPIC, 0.25 OR 0.5 MG/DOSE, 2 MG/3ML SOPN INJECT 0.25 MG INTO THE SKIN ONCE A WEEK. START WITH 0.25MG ONCE A WEEK X 4 WEEKS, THEN INCREASE TO 0.5MG WEEKLY.   No current facility-administered medications on file prior to visit.    Allergies:  No Known  Allergies  Social History:  Social History   Socioeconomic History   Marital status: Married    Spouse name: Not on file   Number of children: Not on file   Years of education: Not on file   Highest education level: Not on file  Occupational History   Not on file  Tobacco Use   Smoking status: Former    Types: Cigarettes   Smokeless tobacco: Never   Tobacco comments:    States she quit a couple of months ago, unsure of date.   Vaping Use   Vaping Use: Never used  Substance and Sexual Activity   Alcohol use: Yes    Comment: occasional   Drug use: No   Sexual activity: Yes  Other Topics Concern   Not on file  Social History Narrative   Not on file   Social  Determinants of Health   Financial Resource Strain: Not on file  Food Insecurity: Not on file  Transportation Needs: Not on file  Physical Activity: Not on file  Stress: Not on file  Social Connections: Not on file  Intimate Partner Violence: Not on file   Social History   Tobacco Use  Smoking Status Former   Types: Cigarettes  Smokeless Tobacco Never  Tobacco Comments   States she quit a couple of months ago, unsure of date.    Social History   Substance and Sexual Activity  Alcohol Use Yes   Comment: occasional    Family History:  Family History  Problem Relation Age of Onset   Cancer Mother        breast   Diabetes Father    Allergies Daughter     Past medical history, surgical history, medications, allergies, family history and social history reviewed with patient today and changes made to appropriate areas of the chart.   Review of Systems  HENT:         Denies vision changes.  Eyes:  Negative for blurred vision and double vision.  Respiratory:  Negative for shortness of breath.   Cardiovascular:  Negative for chest pain, palpitations and leg swelling.  Neurological:  Negative for dizziness, tingling and headaches.  Endo/Heme/Allergies:  Negative for polydipsia.       Denies Polyuria   All other ROS negative except what is listed above and in the HPI.      Objective:    BP 113/81   Pulse 73   Temp 98.3 F (36.8 C) (Oral)   Ht _0  (1.549 m)   Wt 163 lb 12.8 oz (74.3 kg)   SpO2 97%   BMI 30.95 kg/m   Wt Readings from Last 3 Encounters:  10/24/22 163 lb 12.8 oz (74.3 kg)  04/23/22 171 lb 3.2 oz (77.7 kg)  01/28/22 175 lb 9.6 oz (79.7 kg)    Physical Exam Vitals and nursing note reviewed. Exam conducted with a chaperone present (Destiny Willapa, CMA).  Constitutional:      General: She is awake. She is not in acute distress.    Appearance: Normal appearance. She is well-developed. She is obese. She is not ill-appearing.  HENT:     Head:  Normocephalic and atraumatic.     Right Ear: Hearing, tympanic membrane, ear canal and external ear normal. No drainage.     Left Ear: Hearing, tympanic membrane, ear canal and external ear normal. No drainage.     Nose: Nose normal.     Right Sinus: No maxillary sinus tenderness or frontal sinus tenderness.  Left Sinus: No maxillary sinus tenderness or frontal sinus tenderness.     Mouth/Throat:     Mouth: Mucous membranes are moist.     Pharynx: Oropharynx is clear. Uvula midline. No pharyngeal swelling, oropharyngeal exudate or posterior oropharyngeal erythema.  Eyes:     General: Lids are normal.        Right eye: No discharge.        Left eye: No discharge.     Extraocular Movements: Extraocular movements intact.     Conjunctiva/sclera: Conjunctivae normal.     Pupils: Pupils are equal, round, and reactive to light.     Visual Fields: Right eye visual fields normal and left eye visual fields normal.  Neck:     Thyroid: No thyromegaly.     Vascular: No carotid bruit.     Trachea: Trachea normal.  Cardiovascular:     Rate and Rhythm: Normal rate and regular rhythm.     Heart sounds: Normal heart sounds. No murmur heard.    No gallop.  Pulmonary:     Effort: Pulmonary effort is normal. No accessory muscle usage or respiratory distress.     Breath sounds: Normal breath sounds.  Chest:  Breasts:    Right: Normal.     Left: Normal.  Abdominal:     General: Bowel sounds are normal.     Palpations: Abdomen is soft. There is no hepatomegaly or splenomegaly.     Tenderness: There is no abdominal tenderness.  Genitourinary:    Vagina: Normal.     Cervix: Normal.     Adnexa: Right adnexa normal and left adnexa normal.  Musculoskeletal:        General: Normal range of motion.     Cervical back: Normal range of motion and neck supple.     Right lower leg: No edema.     Left lower leg: No edema.  Lymphadenopathy:     Head:     Right side of head: No submental, submandibular,  tonsillar, preauricular or posterior auricular adenopathy.     Left side of head: No submental, submandibular, tonsillar, preauricular or posterior auricular adenopathy.     Cervical: No cervical adenopathy.     Upper Body:     Right upper body: No supraclavicular, axillary or pectoral adenopathy.     Left upper body: No supraclavicular, axillary or pectoral adenopathy.  Skin:    General: Skin is warm and dry.     Capillary Refill: Capillary refill takes less than 2 seconds.     Findings: No rash.  Neurological:     Mental Status: She is alert and oriented to person, place, and time.     Gait: Gait is intact.     Deep Tendon Reflexes: Reflexes are normal and symmetric.     Reflex Scores:      Brachioradialis reflexes are 2+ on the right side and 2+ on the left side.      Patellar reflexes are 2+ on the right side and 2+ on the left side. Psychiatric:        Attention and Perception: Attention normal.        Mood and Affect: Mood normal.        Speech: Speech normal.        Behavior: Behavior normal. Behavior is cooperative.        Thought Content: Thought content normal.        Judgment: Judgment normal.     Results for orders placed or performed in visit  on 04/23/22  Comp Met (CMET)  Result Value Ref Range   Glucose 90 70 - 99 mg/dL   BUN 12 6 - 24 mg/dL   Creatinine, Ser 0.60 0.57 - 1.00 mg/dL   eGFR 110 >59 mL/min/1.73   BUN/Creatinine Ratio 20 9 - 23   Sodium 143 134 - 144 mmol/L   Potassium 4.3 3.5 - 5.2 mmol/L   Chloride 105 96 - 106 mmol/L   CO2 23 20 - 29 mmol/L   Calcium 9.3 8.7 - 10.2 mg/dL   Total Protein 6.3 6.0 - 8.5 g/dL   Albumin 4.0 3.8 - 4.8 g/dL   Globulin, Total 2.3 1.5 - 4.5 g/dL   Albumin/Globulin Ratio 1.7 1.2 - 2.2   Bilirubin Total <0.2 0.0 - 1.2 mg/dL   Alkaline Phosphatase 98 44 - 121 IU/L   AST 11 0 - 40 IU/L   ALT 17 0 - 32 IU/L  Lipid Profile  Result Value Ref Range   Cholesterol, Total 188 100 - 199 mg/dL   Triglycerides 105 0 - 149  mg/dL   HDL 59 >39 mg/dL   VLDL Cholesterol Cal 19 5 - 40 mg/dL   LDL Chol Calc (NIH) 110 (H) 0 - 99 mg/dL   Chol/HDL Ratio 3.2 0.0 - 4.4 ratio  HgB A1c  Result Value Ref Range   Hgb A1c MFr Bld 6.1 (H) 4.8 - 5.6 %   Est. average glucose Bld gHb Est-mCnc 128 mg/dL      Assessment & Plan:   Problem List Items Addressed This Visit       Cardiovascular and Mediastinum   Hypertension    Chronic.  Controlled.  Continue with current medication regimen of Lisinopril 2.36m.  Labs ordered today.  Return to clinic in 6 months for reevaluation.  Call sooner if concerns arise.        Relevant Medications   rosuvastatin (CRESTOR) 5 MG tablet   lisinopril (ZESTRIL) 2.5 MG tablet     Endocrine   Type 2 diabetes mellitus with diabetic neuropathy, without long-term current use of insulin (HCC)    Chronic.  Controlled.  Continue with current medication regimen of metformin and Ozempic.  If A1c is 6.5 or less can decrease Metformin to 50621mBID.  Will start Crestor for Cardioprotection.  Labs ordered today.  Return to clinic in 6 months for reevaluation.  Call sooner if concerns arise.        Relevant Medications   rosuvastatin (CRESTOR) 5 MG tablet   lisinopril (ZESTRIL) 2.5 MG tablet   Other Relevant Orders   HgB A1c   Urine Microalbumin w/creat. ratio     Other   Hyperlipidemia    Chronic.  Will start Crestor 21m24mor Cardioprotection.  Side effects and benefits of medication discussed during visit.  Labs ordered today.  Follow up in 6 months.  Call sooner if concerns arise.       Relevant Medications   rosuvastatin (CRESTOR) 5 MG tablet   lisinopril (ZESTRIL) 2.5 MG tablet   Other Visit Diagnoses     Annual physical exam    -  Primary   Health maintenance reviewed during visit today.  Labs ordered. Flu shot up to date. Shingles shot given. PAP up to date. Colonoscopy ordered.   Relevant Orders   CBC with Differential/Platelet   Comprehensive metabolic panel   Lipid panel   TSH    Urinalysis, Routine w reflex microscopic   HgB A1c   Urine Microalbumin w/creat. ratio  MM 3D SCREEN BREAST BILATERAL   Ambulatory referral to Gastroenterology   Encounter for screening mammogram for malignant neoplasm of breast       Screening for colon cancer       Need for shingles vaccine       Relevant Orders   Zoster Recombinant (Shingrix )        Follow up plan: Return in about 6 months (around 04/24/2023) for HTN, HLD, DM2 FU.   LABORATORY TESTING:  - Pap smear: pap done  IMMUNIZATIONS:   - Tdap: Tetanus vaccination status reviewed: last tetanus booster within 10 years. - Influenza: Up to date - Pneumovax: Administered today - Prevnar: Not applicable - HPV: Not applicable - Zostavax vaccine: Not applicable  SCREENING: -Mammogram:  Ordered at last visit- reminded patient today   - Colonoscopy:  Discussed at visit. Will wait until 51.   - Bone Density: Not applicable  -Hearing Test: Not applicable  -Spirometry: Not applicable   PATIENT COUNSELING:   Advised to take 1 mg of folate supplement per day if capable of pregnancy.   Sexuality: Discussed sexually transmitted diseases, partner selection, use of condoms, avoidance of unintended pregnancy  and contraceptive alternatives.   Advised to avoid cigarette smoking.  I discussed with the patient that most people either abstain from alcohol or drink within safe limits (<=14/week and <=4 drinks/occasion for males, <=7/weeks and <= 3 drinks/occasion for females) and that the risk for alcohol disorders and other health effects rises proportionally with the number of drinks per week and how often a drinker exceeds daily limits.  Discussed cessation/primary prevention of drug use and availability of treatment for abuse.   Diet: Encouraged to adjust caloric intake to maintain  or achieve ideal body weight, to reduce intake of dietary saturated fat and total fat, to limit sodium intake by avoiding high sodium foods and not  adding table salt, and to maintain adequate dietary potassium and calcium preferably from fresh fruits, vegetables, and low-fat dairy products.    stressed the importance of regular exercise  Injury prevention: Discussed safety belts, safety helmets, smoke detector, smoking near bedding or upholstery.   Dental health: Discussed importance of regular tooth brushing, flossing, and dental visits.    NEXT PREVENTATIVE PHYSICAL DUE IN 1 YEAR. Return in about 6 months (around 04/24/2023) for HTN, HLD, DM2 FU.

## 2022-10-24 ENCOUNTER — Ambulatory Visit (INDEPENDENT_AMBULATORY_CARE_PROVIDER_SITE_OTHER): Payer: BC Managed Care – PPO | Admitting: Nurse Practitioner

## 2022-10-24 ENCOUNTER — Encounter: Payer: Self-pay | Admitting: Nurse Practitioner

## 2022-10-24 VITALS — BP 113/81 | HR 73 | Temp 98.3°F | Ht 61.0 in | Wt 163.8 lb

## 2022-10-24 DIAGNOSIS — Z1231 Encounter for screening mammogram for malignant neoplasm of breast: Secondary | ICD-10-CM

## 2022-10-24 DIAGNOSIS — I10 Essential (primary) hypertension: Secondary | ICD-10-CM

## 2022-10-24 DIAGNOSIS — Z23 Encounter for immunization: Secondary | ICD-10-CM | POA: Diagnosis not present

## 2022-10-24 DIAGNOSIS — Z1211 Encounter for screening for malignant neoplasm of colon: Secondary | ICD-10-CM

## 2022-10-24 DIAGNOSIS — Z0000 Encounter for general adult medical examination without abnormal findings: Secondary | ICD-10-CM

## 2022-10-24 DIAGNOSIS — E1140 Type 2 diabetes mellitus with diabetic neuropathy, unspecified: Secondary | ICD-10-CM | POA: Diagnosis not present

## 2022-10-24 DIAGNOSIS — E785 Hyperlipidemia, unspecified: Secondary | ICD-10-CM

## 2022-10-24 DIAGNOSIS — Z136 Encounter for screening for cardiovascular disorders: Secondary | ICD-10-CM | POA: Diagnosis not present

## 2022-10-24 LAB — URINALYSIS, ROUTINE W REFLEX MICROSCOPIC
Bilirubin, UA: NEGATIVE
Glucose, UA: NEGATIVE
Leukocytes,UA: NEGATIVE
Nitrite, UA: NEGATIVE
Protein,UA: NEGATIVE
RBC, UA: NEGATIVE
Specific Gravity, UA: >1.03 — ABNORMAL HIGH (ref 1.005–1.030)
Urobilinogen, Ur: 0.2 mg/dL (ref 0.2–1.0)
pH, UA: 5.5 (ref 5.0–7.5)

## 2022-10-24 LAB — MICROALBUMIN, URINE WAIVED
Creatinine, Urine Waived: 200 mg/dL (ref 10–300)
Microalb, Ur Waived: 10 mg/L (ref 0–19)
Microalb/Creat Ratio: <30 mg/g (ref <30)

## 2022-10-24 MED ORDER — LISINOPRIL 2.5 MG PO TABS: 2.5000 mg | ORAL_TABLET | Freq: Every day | ORAL | 1 refills | Status: DC

## 2022-10-24 MED ORDER — ROSUVASTATIN CALCIUM 5 MG PO TABS
5.0000 mg | ORAL_TABLET | Freq: Every day | ORAL | 1 refills | Status: DC
Start: 2022-10-24 — End: 2023-04-21

## 2022-10-24 NOTE — Assessment & Plan Note (Addendum)
Chronic.  Controlled.  Continue with current medication regimen of Lisinopril 2.'5mg'$ .  Labs ordered today.  Return to clinic in 6 months for reevaluation.  Call sooner if concerns arise.

## 2022-10-24 NOTE — Assessment & Plan Note (Signed)
Chronic.  Will start Crestor '5mg'$  for Cardioprotection.  Side effects and benefits of medication discussed during visit.  Labs ordered today.  Follow up in 6 months.  Call sooner if concerns arise.

## 2022-10-24 NOTE — Assessment & Plan Note (Addendum)
Chronic.  Controlled.  Continue with current medication regimen of metformin and Ozempic.  If A1c is 6.5 or less can decrease Metformin to '500mg'$  BID.  Will start Crestor for Cardioprotection.  Labs ordered today.  Return to clinic in 6 months for reevaluation.  Call sooner if concerns arise.

## 2022-10-25 LAB — COMPREHENSIVE METABOLIC PANEL
ALT: 21 IU/L (ref 0–32)
AST: 15 IU/L (ref 0–40)
Albumin/Globulin Ratio: 1.8 (ref 1.2–2.2)
Albumin: 3.9 g/dL (ref 3.9–4.9)
Alkaline Phosphatase: 97 IU/L (ref 44–121)
BUN/Creatinine Ratio: 20 (ref 9–23)
BUN: 13 mg/dL (ref 6–24)
Bilirubin Total: 0.3 mg/dL (ref 0.0–1.2)
CO2: 24 mmol/L (ref 20–29)
Calcium: 9.5 mg/dL (ref 8.7–10.2)
Chloride: 107 mmol/L — ABNORMAL HIGH (ref 96–106)
Creatinine, Ser: 0.64 mg/dL (ref 0.57–1.00)
Globulin, Total: 2.2 g/dL (ref 1.5–4.5)
Glucose: 90 mg/dL (ref 70–99)
Potassium: 4.5 mmol/L (ref 3.5–5.2)
Sodium: 142 mmol/L (ref 134–144)
Total Protein: 6.1 g/dL (ref 6.0–8.5)
eGFR: 108 mL/min/{1.73_m2} (ref >59)

## 2022-10-25 LAB — CBC WITH DIFFERENTIAL/PLATELET
Basophils Absolute: 0 10*3/uL (ref 0.0–0.2)
Basos: 1 %
EOS (ABSOLUTE): 0.1 10*3/uL (ref 0.0–0.4)
Eos: 2 %
Hematocrit: 40.8 % (ref 34.0–46.6)
Hemoglobin: 13.4 g/dL (ref 11.1–15.9)
Immature Grans (Abs): 0 10*3/uL (ref 0.0–0.1)
Immature Granulocytes: 0 %
Lymphocytes Absolute: 1.6 10*3/uL (ref 0.7–3.1)
Lymphs: 33 %
MCH: 29.6 pg (ref 26.6–33.0)
MCHC: 32.8 g/dL (ref 31.5–35.7)
MCV: 90 fL (ref 79–97)
Monocytes Absolute: 0.3 10*3/uL (ref 0.1–0.9)
Monocytes: 6 %
Neutrophils Absolute: 3 10*3/uL (ref 1.4–7.0)
Neutrophils: 58 %
Platelets: 261 10*3/uL (ref 150–450)
RBC: 4.52 x10E6/uL (ref 3.77–5.28)
RDW: 12.7 % (ref 11.7–15.4)
WBC: 5 10*3/uL (ref 3.4–10.8)

## 2022-10-25 LAB — LIPID PANEL
Chol/HDL Ratio: 3.2 ratio (ref 0.0–4.4)
Cholesterol, Total: 197 mg/dL (ref 100–199)
HDL: 61 mg/dL (ref >39)
LDL Chol Calc (NIH): 119 mg/dL — ABNORMAL HIGH (ref 0–99)
Triglycerides: 94 mg/dL (ref 0–149)
VLDL Cholesterol Cal: 17 mg/dL (ref 5–40)

## 2022-10-25 LAB — HEMOGLOBIN A1C
Est. average glucose Bld gHb Est-mCnc: 120 mg/dL
Hgb A1c MFr Bld: 5.8 % — ABNORMAL HIGH (ref 4.8–5.6)

## 2022-10-25 LAB — TSH: TSH: 1.19 u[IU]/mL (ref 0.450–4.500)

## 2022-10-27 ENCOUNTER — Other Ambulatory Visit: Payer: Self-pay

## 2022-10-27 ENCOUNTER — Telehealth: Payer: Self-pay

## 2022-10-27 DIAGNOSIS — Z1211 Encounter for screening for malignant neoplasm of colon: Secondary | ICD-10-CM

## 2022-10-27 MED ORDER — NA SULFATE-K SULFATE-MG SULF 17.5-3.13-1.6 GM/177ML PO SOLN: 1.0000 | Freq: Once | ORAL | 0 refills | Status: AC

## 2022-10-27 NOTE — Progress Notes (Signed)
Hi Chelsea Mayer. It was nice to see you last week.  Your lab work looks good.  A1c is well controlled at 5.8.  No concerns at this time. Continue with your current medication regimen.  Follow up as discussed.  Please let me know if you have any questions.

## 2022-10-27 NOTE — Telephone Encounter (Signed)
Gastroenterology Pre-Procedure Review  Request Date: 12/05/22 Requesting Physician: Dr. Vicente Males  PATIENT REVIEW QUESTIONS: The patient responded to the following health history questions as indicated:    1. Are you having any GI issues? no 2. Do you have a personal history of Polyps? no 3. Do you have a family history of Colon Cancer or Polyps? no 4. Diabetes Mellitus? yes (Stop Ozempic 7 days before colonoscopy on 12/08.  Stop Metformin 2 days before colonoscopy on 12/03/22.  Hold other meds the morning of procedure.) 5. Joint replacements in the past 12 months?no 6. Major health problems in the past 3 months?no 7. Any artificial heart valves, MVP, or defibrillator?no    MEDICATIONS & ALLERGIES:    Patient reports the following regarding taking any anticoagulation/antiplatelet therapy:   Plavix, Coumadin, Eliquis, Xarelto, Lovenox, Pradaxa, Brilinta, or Effient? no Aspirin? no  Patient confirms/reports the following medications:  Current Outpatient Medications  Medication Sig Dispense Refill   lisinopril (ZESTRIL) 2.5 MG tablet Take 1 tablet (2.5 mg total) by mouth daily. 90 tablet 1   metFORMIN (GLUCOPHAGE) 500 MG tablet TAKE 1 TAB BY MOUTH ONCE DAILY FOR 1 WEEK, THEN INCREASE TO 2 TABS DAILY FOR THE SECOND WEEK, THEN 3 TABS WEEK 3 AND 4 TABS ON WEEK 4. 180 tablet 1   OZEMPIC, 0.25 OR 0.5 MG/DOSE, 2 MG/3ML SOPN INJECT 0.25 MG INTO THE SKIN ONCE A WEEK. START WITH 0.'25MG'$  ONCE A WEEK X 4 WEEKS, THEN INCREASE TO 0.'5MG'$  WEEKLY. 9 mL 1   rosuvastatin (CRESTOR) 5 MG tablet Take 1 tablet (5 mg total) by mouth daily. 90 tablet 1   No current facility-administered medications for this visit.    Patient confirms/reports the following allergies:  No Known Allergies  No orders of the defined types were placed in this encounter.   AUTHORIZATION INFORMATION Primary Insurance: 1D#: Group #:  Secondary Insurance: 1D#: Group #:  SCHEDULE INFORMATION: Date: 12/05/22 Time: Location:  [facility]

## 2022-11-03 ENCOUNTER — Encounter: Payer: Self-pay | Admitting: Nurse Practitioner

## 2022-11-03 MED ORDER — TIRZEPATIDE 5 MG/0.5ML ~~LOC~~ SOAJ: 5.0000 mg | SUBCUTANEOUS | 1 refills | Status: DC

## 2022-11-11 ENCOUNTER — Telehealth: Payer: Self-pay

## 2022-11-11 NOTE — Telephone Encounter (Signed)
PA for Darcel Bayley has been initiated and approved via covermymeds. Patient has been notified via [employer]. Pt is approved from 11/11/2022 through 11/10/2023.  KEY: [id number]

## 2022-11-27 ENCOUNTER — Ambulatory Visit
Admission: RE | Admit: 2022-11-27 | Discharge: 2022-11-27 | Disposition: A | Discharge status: Home / Self Care | Payer: BC Managed Care – PPO | Source: Ambulatory Visit | Attending: Nurse Practitioner | Admitting: Nurse Practitioner

## 2022-11-27 ENCOUNTER — Other Ambulatory Visit: Payer: Self-pay | Admitting: Nurse Practitioner

## 2022-11-27 DIAGNOSIS — Z0000 Encounter for general adult medical examination without abnormal findings: Secondary | ICD-10-CM | POA: Insufficient documentation

## 2022-11-27 DIAGNOSIS — Z1231 Encounter for screening mammogram for malignant neoplasm of breast: Secondary | ICD-10-CM | POA: Insufficient documentation

## 2022-11-27 NOTE — Telephone Encounter (Signed)
Requested medication (s) are due for refill today: yes   Requested medication (s) are on the active medication list: yes   Last refill:  08/26/22 #180 1 refill  Future visit scheduled: yes in 4 months  Notes to clinic:  tapered drug. Do you want to refill Rx?     Requested Prescriptions  Pending Prescriptions Disp Refills   metFORMIN (GLUCOPHAGE) 500 MG tablet [Pharmacy Med Name: METFORMIN HCL 500 MG TABLET] 120 tablet 2    Sig: TAKE 1 TAB BY MOUTH ONCE DAILY FOR 1 WEEK, THEN INCREASE TO 2 TABS DAILY FOR THE SECOND WEEK, THEN 3 TABS WEEK 3 AND 4 TABS ON WEEK 4.     Endocrinology:  Diabetes - Biguanides Failed - 11/27/2022  2:58 AM      Failed - B12 Level in normal range and within 720 days    Vitamin B-12  Date Value Ref Range Status  09/26/2015 616 211 - 946 pg/mL Final         Passed - Cr in normal range and within 360 days    Creatinine  Date Value Ref Range Status  04/29/2013 0.54 (L) 0.60 - 1.30 mg/dL Final   Creatinine, Ser  Date Value Ref Range Status  10/24/2022 0.64 0.57 - 1.00 mg/dL Final         Passed - HBA1C is between 0 and 7.9 and within 180 days    Hgb A1c MFr Bld  Date Value Ref Range Status  10/24/2022 5.8 (H) 4.8 - 5.6 % Final    Comment:             Prediabetes: 5.7 - 6.4          Diabetes: >6.4          Glycemic control for adults with diabetes: <7.0          Passed - eGFR in normal range and within 360 days    EGFR (African American)  Date Value Ref Range Status  04/29/2013 >60  Final   GFR calc Af Amer  Date Value Ref Range Status  09/26/2015 124 >59 mL/min/1.73 Final   EGFR (Non-African Amer.)  Date Value Ref Range Status  04/29/2013 >60  Final    Comment:    eGFR values <50m/min/1.73 m2 may be an indication of chronic kidney disease (CKD). Calculated eGFR is useful in patients with stable renal function. The eGFR calculation will not be reliable in acutely ill patients when serum creatinine is changing rapidly. It is not useful in   patients on dialysis. The eGFR calculation may not be applicable to patients at the low and high extremes of body sizes, pregnant women, and vegetarians.    GFR calc non Af Amer  Date Value Ref Range Status  09/26/2015 107 >59 mL/min/1.73 Final   eGFR  Date Value Ref Range Status  10/24/2022 108 >59 mL/min/1.73 Final         Passed - Valid encounter within last 6 months    Recent Outpatient Visits           1 month ago Annual physical exam   CFowler NP   7 months ago [facility]   [facility]   10 months ago Type 2 diabetes mellitus with diabetic neuropathy, without long-term current use of insulin ([facility]   1 year ago Type 2 diabetes mellitus without complication, without long-term current use of insulin ([facility]  [facility] [name], NP   1 year ago Diabetes mellitus without complication [facility])   [facility] [name], NP       Future Appointments             In 4 months [name], NP [facility]            Passed - CBC within normal limits and completed in the last 12 months    WBC  Date Value Ref Range Status  10/24/2022 5.0 3.4 - 10.8 x10E3/uL Final  04/29/2013 6.7 3.6 - 11.0 x10 3/mm 3 Final   RBC  Date Value Ref Range Status  10/24/2022 4.52 3.77 - 5.28 x10E6/uL Final  04/29/2013 4.85 3.80 - 5.20 X10 6/mm 3 Final   Hemoglobin  Date Value Ref Range Status  10/24/2022 13.4 11.1 - 15.9 g/dL Final   Hematocrit  Date Value Ref Range Status  10/24/2022 40.8 34.0 - 46.6 % Final   MCHC  Date Value Ref Range Status  10/24/2022 32.8 31.5 - 35.7 g/dL Final  04/29/2013 34.4 32.0 - 36.0 g/dL Final   [facility]  Date Value Ref Range Status  10/24/2022 29.6 26.6 - 33.0 pg Final  04/29/2013 29.8 26.0 - 34.0 pg Final   MCV  Date Value Ref Range Status  10/24/2022 90 79 -  97 fL Final  04/29/2013 87 80 - 100 fL Final   No results found for: "PLTCOUNTKUC", "LABPLAT", "POCPLA" RDW  Date Value Ref Range Status  10/24/2022 12.7 11.7 - 15.4 % Final  04/29/2013 13.3 11.5 - 14.5 % Final

## 2022-11-28 ENCOUNTER — Other Ambulatory Visit: Payer: Self-pay | Admitting: Nurse Practitioner

## 2022-11-28 DIAGNOSIS — R928 Other abnormal and inconclusive findings on diagnostic imaging of breast: Secondary | ICD-10-CM

## 2022-11-28 DIAGNOSIS — N6489 Other specified disorders of breast: Secondary | ICD-10-CM

## 2022-11-28 NOTE — Progress Notes (Signed)
Hi Chelsea Mayer.  Your mammogram showed some asymmetry in the right breast.  They would like to do a diagnostic mammogram and Ultrasound.  I have placed these orders. Let me know if you have problems getting it scheduled.

## 2022-12-04 ENCOUNTER — Encounter: Payer: Self-pay | Admitting: *Deleted

## 2022-12-05 ENCOUNTER — Ambulatory Visit: Payer: BC Managed Care – PPO | Admitting: Anesthesiology

## 2022-12-05 ENCOUNTER — Encounter: Payer: Self-pay | Admitting: Gastroenterology

## 2022-12-05 ENCOUNTER — Ambulatory Visit
Admission: RE | Admit: 2022-12-05 | Discharge: 2022-12-05 | Disposition: A | Discharge status: Home / Self Care | Payer: BC Managed Care – PPO | Attending: Gastroenterology | Admitting: Gastroenterology

## 2022-12-05 ENCOUNTER — Encounter
Admission: RE | Disposition: A | Discharge status: Home / Self Care | Payer: Self-pay | Source: Home / Self Care | Attending: Gastroenterology

## 2022-12-05 DIAGNOSIS — Z7984 Long term (current) use of oral hypoglycemic drugs: Secondary | ICD-10-CM | POA: Insufficient documentation

## 2022-12-05 DIAGNOSIS — Z7985 Long-term (current) use of injectable non-insulin antidiabetic drugs: Secondary | ICD-10-CM | POA: Diagnosis not present

## 2022-12-05 DIAGNOSIS — I10 Essential (primary) hypertension: Secondary | ICD-10-CM | POA: Insufficient documentation

## 2022-12-05 DIAGNOSIS — E119 Type 2 diabetes mellitus without complications: Secondary | ICD-10-CM | POA: Diagnosis not present

## 2022-12-05 DIAGNOSIS — E785 Hyperlipidemia, unspecified: Secondary | ICD-10-CM | POA: Insufficient documentation

## 2022-12-05 DIAGNOSIS — Z9884 Bariatric surgery status: Secondary | ICD-10-CM | POA: Diagnosis not present

## 2022-12-05 DIAGNOSIS — Z87891 Personal history of nicotine dependence: Secondary | ICD-10-CM | POA: Diagnosis not present

## 2022-12-05 DIAGNOSIS — Z9049 Acquired absence of other specified parts of digestive tract: Secondary | ICD-10-CM | POA: Diagnosis not present

## 2022-12-05 DIAGNOSIS — Z1211 Encounter for screening for malignant neoplasm of colon: Secondary | ICD-10-CM

## 2022-12-05 HISTORY — PX: COLONOSCOPY WITH PROPOFOL: SHX5780

## 2022-12-05 LAB — GLUCOSE, CAPILLARY: Glucose-Capillary: 100 mg/dL — ABNORMAL HIGH (ref 70–99)

## 2022-12-05 SURGERY — COLONOSCOPY WITH PROPOFOL
Anesthesia: General

## 2022-12-05 MED ORDER — PROPOFOL 500 MG/50ML IV EMUL
INTRAVENOUS | Status: DC | PRN
  Administered 2022-12-05: 229.109 ug/kg/min via INTRAVENOUS

## 2022-12-05 MED ORDER — PHENYLEPHRINE HCL (PRESSORS) 10 MG/ML IV SOLN
INTRAVENOUS | Status: DC | PRN
  Administered 2022-12-05: 160 ug via INTRAVENOUS
  Administered 2022-12-05: 240 ug via INTRAVENOUS
  Administered 2022-12-05: 160 ug via INTRAVENOUS

## 2022-12-05 MED ORDER — LIDOCAINE HCL (CARDIAC) PF 100 MG/5ML IV SOSY
PREFILLED_SYRINGE | INTRAVENOUS | Status: DC | PRN
  Administered 2022-12-05: 100 mg via INTRAVENOUS

## 2022-12-05 MED ORDER — SODIUM CHLORIDE 0.9 % IV SOLN: INTRAVENOUS | Status: DC

## 2022-12-05 MED ORDER — PROPOFOL 10 MG/ML IV BOLUS
INTRAVENOUS | Status: DC | PRN
  Administered 2022-12-05: 90 mg via INTRAVENOUS

## 2022-12-05 MED ORDER — EPHEDRINE SULFATE (PRESSORS) 50 MG/ML IJ SOLN
INTRAMUSCULAR | Status: DC | PRN
  Administered 2022-12-05: 10 mg via INTRAVENOUS

## 2022-12-05 NOTE — Anesthesia Preprocedure Evaluation (Addendum)
Anesthesia Evaluation  Patient identified by MRN, date of birth, ID band Patient awake    Reviewed: Allergy & Precautions, NPO status , Patient's Chart, lab work & pertinent test results  Airway Mallampati: III  TM Distance: <3 FB Neck ROM: full    Dental  (+) Chipped   Pulmonary neg shortness of breath, former smoker   Pulmonary exam normal        Cardiovascular Exercise Tolerance: Good hypertension, (-) angina Normal cardiovascular exam     Neuro/Psych negative neurological ROS  negative psych ROS   GI/Hepatic negative GI ROS, Neg liver ROS,neg GERD  ,,  Endo/Other  diabetes, Type 2    Renal/GU negative Renal ROS  negative genitourinary   Musculoskeletal   Abdominal   Peds  Hematology negative hematology ROS (+)   Anesthesia Other Findings Past Medical History: No date: Hyperlipidemia No date: Hypertension  Past Surgical History: No date: CHOLECYSTECTOMY 2009: GASTRIC BYPASS  BMI    Body Mass Index: 30.23 kg/m      Reproductive/Obstetrics negative OB ROS                             Anesthesia Physical Anesthesia Plan  ASA: 3  Anesthesia Plan: General   Post-op Pain Management:    Induction: Intravenous  PONV Risk Score and Plan: Propofol infusion and TIVA  Airway Management Planned: Natural Airway and Nasal Cannula  Additional Equipment:   Intra-op Plan:   Post-operative Plan:   Informed Consent: I have reviewed the patients History and Physical, chart, labs and discussed the procedure including the risks, benefits and alternatives for the proposed anesthesia with the patient or authorized representative who has indicated his/her understanding and acceptance.     Dental Advisory Given  Plan Discussed with: Anesthesiologist, CRNA and Surgeon  Anesthesia Plan Comments: (Patient consented for risks of anesthesia including but not limited to:  - adverse  reactions to medications - risk of airway placement if required - damage to eyes, teeth, lips or other oral mucosa - nerve damage due to positioning  - sore throat or hoarseness - Damage to heart, brain, nerves, lungs, other parts of body or loss of life  Patient voiced understanding.)       Anesthesia Quick Evaluation

## 2022-12-05 NOTE — Op Note (Signed)
[facility] Patient Name: Chelsea Mayer Procedure Date: 12/05/2022 8:18 AM MRN: [id number] Account #: [id number] Date of Birth: 11-09-1971 Admit Type: Outpatient Age: 51 Room: [facility] ENDO ROOM 4 Gender: Female Note Status: Finalized Instrument Name: Jasper Riling [id number] Procedure:             Colonoscopy Indications:           Screening for colorectal malignant neoplasm Providers:             Jonathon Bellows MD, MD Referring MD:          [name] (Referring MD) Medicines:             Monitored Anesthesia Care Complications:         No immediate complications. Procedure:             Pre-Anesthesia Assessment:                        - Prior to the procedure, a History and Physical was                         performed, and patient medications, allergies and                         sensitivities were reviewed. The patient's tolerance                         of previous anesthesia was reviewed.                        - The risks and benefits of the procedure and the                         sedation options and risks were discussed with the                         patient. All questions were answered and informed                         consent was obtained.                        - ASA Grade Assessment: II - A patient with mild                         systemic disease.                        After obtaining informed consent, the colonoscope was                         passed under direct vision. Throughout the procedure,                         the patient's blood pressure, pulse, and oxygen                         saturations were monitored continuously. The                         Colonoscope was introduced through  the anus and                         advanced to the the cecum, identified by the                         appendiceal orifice. The colonoscopy was performed                         without difficulty. The patient tolerated the                          procedure well. The quality of the bowel preparation                         was excellent. The ileocecal valve, appendiceal                         orifice, and rectum were photographed. Findings:      The perianal and digital rectal examinations were normal.      The entire examined colon appeared normal on direct and retroflexion       views. Impression:            - The entire examined colon is normal on direct and                         retroflexion views.                        - No specimens collected. Recommendation:        - Discharge patient to home (with escort).                        - Resume previous diet.                        - Continue present medications.                        - Repeat colonoscopy in 10 years for screening                         purposes. Procedure Code(s):     --- Professional ---                        [id number], Colonoscopy, flexible; diagnostic, including                         collection of specimen(s) by brushing or washing, when                         performed (separate procedure) Diagnosis Code(s):     --- Professional ---                        Z12.11, Encounter for screening for malignant neoplasm                         of colon CPT copyright 2022 American [employer] Association. All rights reserved. The codes documented in this report are  preliminary and upon coder review may  be revised to meet current compliance requirements. Jonathon Bellows, MD Jonathon Bellows MD, MD 12/05/2022 8:40:34 AM This report has been signed electronically. Number of Addenda: 0 Note Initiated On: 12/05/2022 8:18 AM Scope Withdrawal Time: 0 hours 10 minutes 4 seconds  Total Procedure Duration: 0 hours 13 minutes 8 seconds  Estimated Blood Loss:  Estimated blood loss: none.      [facility]

## 2022-12-05 NOTE — Transfer of Care (Signed)
Immediate Anesthesia Transfer of Care Note  Patient: Chelsea Mayer  Procedure(s) Performed: COLONOSCOPY WITH PROPOFOL  Patient Location: Endoscopy Unit  Anesthesia Type:General  Level of Consciousness: drowsy  Airway & Oxygen Therapy: Patient Spontanous Breathing  Post-op Assessment: Report given to RN and Post -op Vital signs reviewed and stable  Post vital signs: Reviewed and stable  Last Vitals:  Vitals Value Taken Time  BP 151/61 12/05/22 0841  Temp 35.4 C 12/05/22 0841  Pulse 65 12/05/22 0841  Resp 7 12/05/22 0841  SpO2 99 % 12/05/22 0841  Vitals shown include unvalidated device data.  Last Pain:  Vitals:   12/05/22 0841  TempSrc: Temporal  PainSc:          Complications: No notable events documented.

## 2022-12-05 NOTE — Anesthesia Postprocedure Evaluation (Signed)
Anesthesia Post Note  Patient: Chelsea Mayer  Procedure(s) Performed: COLONOSCOPY WITH PROPOFOL  Patient location during evaluation: Endoscopy Anesthesia Type: General Level of consciousness: awake and alert Pain management: pain level controlled Vital Signs Assessment: post-procedure vital signs reviewed and stable Respiratory status: spontaneous breathing, nonlabored ventilation, respiratory function stable and patient connected to nasal cannula oxygen Cardiovascular status: blood pressure returned to baseline and stable Postop Assessment: no apparent nausea or vomiting Anesthetic complications: no   No notable events documented.   Last Vitals:  Vitals:   12/05/22 0853 12/05/22 0902  BP: 90/62 101/69  Pulse: 82 81  Resp: 17 11  Temp:    SpO2: 100%     Last Pain:  Vitals:   12/05/22 0841  TempSrc: Temporal  PainSc:                  Precious Haws Kofi Murrell

## 2022-12-05 NOTE — H&P (Signed)
Jonathon Bellows, MD [address], [place], [place], [place], [address] [address], [place], [place], [place], [address] Phone: [phone]  Fax: [phone]  Primary Care Physician:  [name], NP   Pre-Procedure History & Physical: HPI:  Chelsea Mayer is a 51 y.o. female is here for an colonoscopy.   Past Medical History:  Diagnosis Date   Hyperlipidemia    Hypertension     Past Surgical History:  Procedure Laterality Date   CHOLECYSTECTOMY     GASTRIC BYPASS  2009    Prior to Admission medications   Medication Sig Start Date End Date Taking? Authorizing Provider  metFORMIN (GLUCOPHAGE) 500 MG tablet Take 2 tablets (1,000 mg total) by mouth 2 (two) times daily with a meal. 11/27/22   [name], NP  lisinopril (ZESTRIL) 2.5 MG tablet Take 1 tablet (2.5 mg total) by mouth daily. 10/24/22   [name], NP  rosuvastatin (CRESTOR) 5 MG tablet Take 1 tablet (5 mg total) by mouth daily. 10/24/22   [name], NP  tirzepatide [facility]) 5 MG/0.5ML Pen Inject 5 mg into the skin once a week. 11/03/22   [name], NP    Allergies as of 10/27/2022   (No Known Allergies)    Family History  Problem Relation Age of Onset   Breast cancer Mother 1   Cancer Mother        breast   Diabetes Father    Allergies Daughter     Social History   Socioeconomic History   Marital status: Married    Spouse name: Not on file   Number of children: Not on file   Years of education: Not on file   Highest education level: Not on file  Occupational History   Not on file  Tobacco Use   Smoking status: Former    Types: Cigarettes   Smokeless tobacco: Never   Tobacco comments:    States she quit a couple of months ago, unsure of date.   Vaping Use   Vaping Use: Never used  Substance and Sexual Activity   Alcohol use: Yes    Comment: occasional   Drug use: No   Sexual activity: Yes  Other Topics Concern   Not on file  Social History  Narrative   Not on file   Social Determinants of Health   Financial Resource Strain: Not on file  Food Insecurity: Not on file  Transportation Needs: Not on file  Physical Activity: Not on file  Stress: Not on file  Social Connections: Not on file  Intimate Partner Violence: Not on file    Review of Systems: See HPI, otherwise negative ROS  Physical Exam: BP (!) 123/94   Pulse 88   Temp (!) 97.2 F (36.2 C) (Temporal)   Resp 18   Ht '5\' 1"'$  (1.549 m)   Wt 72.6 kg   SpO2 100%   BMI 30.23 kg/m  General:   Alert,  pleasant and cooperative in NAD Head:  Normocephalic and atraumatic. Neck:  Supple; no masses or thyromegaly. Lungs:  Clear throughout to auscultation, normal respiratory effort.    Heart:  +S1, +S2, Regular rate and rhythm, No edema. Abdomen:  Soft, nontender and nondistended. Normal bowel sounds, without guarding, and without rebound.   Neurologic:  Alert and  oriented x4;  grossly normal neurologically.  Impression/Plan: Chelsea Mayer is here for an colonoscopy to be performed for Screening colonoscopy average risk   Risks, benefits, limitations, and alternatives regarding  colonoscopy  have been reviewed with the patient.  Questions have been answered.  All parties agreeable.   Jonathon Bellows, MD  12/05/2022, 8:13 AM

## 2022-12-08 ENCOUNTER — Encounter: Payer: Self-pay | Admitting: Gastroenterology

## 2022-12-09 ENCOUNTER — Encounter: Payer: Self-pay | Admitting: Nurse Practitioner

## 2023-03-07 ENCOUNTER — Other Ambulatory Visit: Payer: Self-pay | Admitting: Nurse Practitioner

## 2023-03-09 NOTE — Telephone Encounter (Signed)
Requested Prescriptions  Pending Prescriptions Disp Refills   metFORMIN (GLUCOPHAGE) 500 MG tablet [Pharmacy Med Name: METFORMIN HCL 500 MG TABLET] 360 tablet 0    Sig: TAKE 2 TABLETS (1,000 MG TOTAL) BY MOUTH 2 (TWO) TIMES DAILY WITH A MEAL.     Endocrinology:  Diabetes - Biguanides Failed - 03/07/2023  1:15 AM      Failed - B12 Level in normal range and within 720 days    Vitamin B-12  Date Value Ref Range Status  09/26/2015 616 211 - 946 pg/mL Final         Passed - Cr in normal range and within 360 days    Creatinine  Date Value Ref Range Status  04/29/2013 0.54 (L) 0.60 - 1.30 mg/dL Final   Creatinine, Ser  Date Value Ref Range Status  10/24/2022 0.64 0.57 - 1.00 mg/dL Final         Passed - HBA1C is between 0 and 7.9 and within 180 days    Hgb A1c MFr Bld  Date Value Ref Range Status  10/24/2022 5.8 (H) 4.8 - 5.6 % Final    Comment:             Prediabetes: 5.7 - 6.4          Diabetes: >6.4          Glycemic control for adults with diabetes: <7.0          Passed - eGFR in normal range and within 360 days    EGFR (African American)  Date Value Ref Range Status  04/29/2013 >60  Final   GFR calc Af Amer  Date Value Ref Range Status  09/26/2015 124 >59 mL/min/1.73 Final   EGFR (Non-African Amer.)  Date Value Ref Range Status  04/29/2013 >60  Final    Comment:    eGFR values <27mL/min/1.73 m2 may be an indication of chronic kidney disease (CKD). Calculated eGFR is useful in patients with stable renal function. The eGFR calculation will not be reliable in acutely ill patients when serum creatinine is changing rapidly. It is not useful in  patients on dialysis. The eGFR calculation may not be applicable to patients at the low and high extremes of body sizes, pregnant women, and vegetarians.    GFR calc non Af Amer  Date Value Ref Range Status  09/26/2015 107 >59 mL/min/1.73 Final   eGFR  Date Value Ref Range Status  10/24/2022 108 >59 mL/min/1.73 Final          Passed - Valid encounter within last 6 months    Recent Outpatient Visits           4 months ago Annual physical exam   [place] [name], NP   10 months ago Primary hypertension   [place] [name], NP   1 year ago Type 2 diabetes mellitus with diabetic neuropathy, without long-term current use of insulin [facility])   [place] [name], NP   1 year ago Type 2 diabetes mellitus without complication, without long-term current use of insulin ([place])   [place] [name], NP   1 year ago Diabetes mellitus without complication [facility])   [place] [name], NP       Future Appointments             In 1 month [name], NP [place], [facility]  Passed - CBC within normal limits and completed in the last 12 months    WBC  Date Value Ref Range Status  10/24/2022 5.0 3.4 - 10.8 x10E3/uL Final  04/29/2013 6.7 3.6 - 11.0 x10 3/mm 3 Final   RBC  Date Value Ref Range Status  10/24/2022 4.52 3.77 - 5.28 x10E6/uL Final  04/29/2013 4.85 3.80 - 5.20 X10 6/mm 3 Final   Hemoglobin  Date Value Ref Range Status  10/24/2022 13.4 11.1 - 15.9 g/dL Final   Hematocrit  Date Value Ref Range Status  10/24/2022 40.8 34.0 - 46.6 % Final   MCHC  Date Value Ref Range Status  10/24/2022 32.8 31.5 - 35.7 g/dL Final  04/29/2013 34.4 32.0 - 36.0 g/dL Final   [facility]  Date Value Ref Range Status  10/24/2022 29.6 26.6 - 33.0 pg Final  04/29/2013 29.8 26.0 - 34.0 pg Final   MCV  Date Value Ref Range Status  10/24/2022 90 79 - 97 fL Final  04/29/2013 87 80 - 100 fL Final   No results found for: "PLTCOUNTKUC", "LABPLAT", "POCPLA" RDW  Date Value Ref Range Status  10/24/2022 12.7 11.7 - 15.4 % Final  04/29/2013 13.3 11.5 - 14.5 % Final

## 2023-04-13 ENCOUNTER — Other Ambulatory Visit: Payer: Self-pay | Admitting: Nurse Practitioner

## 2023-04-14 NOTE — Telephone Encounter (Signed)
Requested medications are due for refill today.  Provider to determine  Requested medications are on the active medications list.  yes  Last refill. 11/03/2022 6mL 1 rf  Future visit scheduled.   yes  Notes to clinic.  Medication not assigned a protocol, please review for refill.    Requested Prescriptions  Pending Prescriptions Disp Refills   MOUNJARO 5 MG/0.5ML Pen [Pharmacy Med Name: MOUNJARO 5 MG/0.5 ML PEN]  1    Sig: INJECT 5 MG SUBCUTANEOUSLY WEEKLY     Off-Protocol Failed - 04/13/2023  1:46 AM      Failed - Medication not assigned to a protocol, review manually.      Passed - Valid encounter within last 12 months    Recent Outpatient Visits           5 months ago Annual physical exam   [name] [name], NP   11 months ago Primary hypertension   [place] [facility] [name], NP   1 year ago Type 2 diabetes mellitus with diabetic neuropathy, without long-term current use of insulin [facility])   [place] [facility] [name], NP   1 year ago Type 2 diabetes mellitus without complication, without long-term current use of insulin (HCC)   [place] [facility] [name], NP   1 year ago Diabetes mellitus without complication [facility])   [place] [facility] [place] [name], NP       Future Appointments             In 1 week [name], NP [place] [facility], [facility]

## 2023-04-19 ENCOUNTER — Other Ambulatory Visit: Payer: Self-pay | Admitting: Nurse Practitioner

## 2023-04-21 NOTE — Telephone Encounter (Signed)
Requested Prescriptions  Pending Prescriptions Disp Refills   lisinopril (ZESTRIL) 2.5 MG tablet [Pharmacy Med Name: LISINOPRIL 2.5 MG TABLET] 90 tablet 1    Sig: TAKE 1 TABLET BY MOUTH EVERY DAY     Cardiovascular:  ACE Inhibitors Passed - 04/19/2023  1:20 AM      Passed - Cr in normal range and within 180 days    Creatinine  Date Value Ref Range Status  04/29/2013 0.54 (L) 0.60 - 1.30 mg/dL Final   Creatinine, Ser  Date Value Ref Range Status  10/24/2022 0.64 0.57 - 1.00 mg/dL Final         Passed - K in normal range and within 180 days    Potassium  Date Value Ref Range Status  10/24/2022 4.5 3.5 - 5.2 mmol/L Final  04/29/2013 4.1 3.5 - 5.1 mmol/L Final         Passed - Patient is not pregnant      Passed - Last BP in normal range    BP Readings from Last 1 Encounters:  12/05/22 101/69         Passed - Valid encounter within last 6 months    Recent Outpatient Visits           5 months ago Annual physical exam   [facility] [name], NP   12 months ago Primary hypertension   [place] [name], NP   1 year ago Type 2 diabetes mellitus with diabetic neuropathy, without long-term current use of insulin [facility])   [place] [facility] [place], Clydie Braun, NP   1 year ago Type 2 diabetes mellitus without complication, without long-term current use of insulin (HCC)   [place] [facility] [name], NP   1 year ago Diabetes mellitus without complication [facility])   [place] [facility] [name], NP       Future Appointments             In 3 days [name], NP [facility]             rosuvastatin (CRESTOR) 5 MG tablet [Pharmacy Med Name: ROSUVASTATIN CALCIUM 5 MG TAB] 90 tablet 1    Sig: TAKE 1 TABLET (5 MG TOTAL) BY MOUTH DAILY.     Cardiovascular:  Antilipid - Statins 2 Failed - 04/19/2023  1:20 AM       Failed - Lipid Panel in normal range within the last 12 months    Cholesterol, Total  Date Value Ref Range Status  10/24/2022 197 100 - 199 mg/dL Final   LDL Chol Calc (NIH)  Date Value Ref Range Status  10/24/2022 119 (H) 0 - 99 mg/dL Final   HDL  Date Value Ref Range Status  10/24/2022 61 >39 mg/dL Final   Triglycerides  Date Value Ref Range Status  10/24/2022 94 0 - 149 mg/dL Final         Passed - Cr in normal range and within 360 days    Creatinine  Date Value Ref Range Status  04/29/2013 0.54 (L) 0.60 - 1.30 mg/dL Final   Creatinine, Ser  Date Value Ref Range Status  10/24/2022 0.64 0.57 - 1.00 mg/dL Final         Passed - Patient is not pregnant      Passed - Valid encounter within last 12 months    Recent Outpatient Visits  5 months ago Annual physical exam   [facility] [name], NP   12 months ago Primary hypertension   [place] [facility] [name], NP   1 year ago Type 2 diabetes mellitus with diabetic neuropathy, without long-term current use of insulin [facility])   [place] [facility] [name], NP   1 year ago Type 2 diabetes mellitus without complication, without long-term current use of insulin (HCC)   [place] [facility] [name], NP   1 year ago Diabetes mellitus without complication [facility])   [facility] [name], NP       Future Appointments             In 3 days [name], NP [facility], [facility]

## 2023-04-24 ENCOUNTER — Encounter: Payer: Self-pay | Admitting: Nurse Practitioner

## 2023-04-24 ENCOUNTER — Ambulatory Visit: Payer: BC Managed Care – PPO | Admitting: Nurse Practitioner

## 2023-04-24 VITALS — BP 103/70 | HR 78 | Temp 98.0°F | Wt 151.1 lb

## 2023-04-24 DIAGNOSIS — E1140 Type 2 diabetes mellitus with diabetic neuropathy, unspecified: Secondary | ICD-10-CM

## 2023-04-24 DIAGNOSIS — I10 Essential (primary) hypertension: Secondary | ICD-10-CM | POA: Diagnosis not present

## 2023-04-24 DIAGNOSIS — Z6833 Body mass index (BMI) 33.0-33.9, adult: Secondary | ICD-10-CM

## 2023-04-24 DIAGNOSIS — E785 Hyperlipidemia, unspecified: Secondary | ICD-10-CM | POA: Diagnosis not present

## 2023-04-24 DIAGNOSIS — E6609 Other obesity due to excess calories: Secondary | ICD-10-CM | POA: Diagnosis not present

## 2023-04-24 DIAGNOSIS — Z23 Encounter for immunization: Secondary | ICD-10-CM

## 2023-04-24 MED ORDER — METFORMIN HCL 500 MG PO TABS: 1000.0000 mg | ORAL_TABLET | Freq: Two times a day (BID) | ORAL | 1 refills | Status: DC

## 2023-04-24 NOTE — Assessment & Plan Note (Signed)
Chronic.  Controlled.  Continue with current medication regimen of Rosuvastatin 5mg  daily.  Labs ordered today.  Return to clinic in 6 months for reevaluation.  Call sooner if concerns arise.

## 2023-04-24 NOTE — Progress Notes (Signed)
BP 103/70   Pulse 78   Temp 98 F (36.7 C) (Oral)   Wt 151 lb 1.6 oz (68.5 kg)   LMP  (LMP Unknown)   BMI 28.55 kg/m    Subjective:    Patient ID: Chelsea Mayer, female    DOB: [birth year], 52 y.o.   MRN: [id number]  HPI: Chelsea Mayer is a 52 y.o. female  Chief Complaint  Patient presents with   Diabetes   Hypertension   Hyperlipidemia   HYPERTENSION / HYPERLIPIDEMIA Satisfied with current treatment? no Duration of hypertension: years BP monitoring frequency: not checking BP range:  BP medication side effects: no Past BP meds: Lisinopril Duration of hyperlipidemia: years Cholesterol medication side effects: no Cholesterol supplements: none Past cholesterol medications: Rosuvastatin Medication compliance:  NA Aspirin: no Recent stressors: no Recurrent headaches: no Visual changes: no Palpitations: no Dyspnea: no Chest pain: no Lower extremity edema: no Dizzy/lightheaded: no  DIABETES Hypoglycemic episodes:no Polydipsia/polyuria: no Visual disturbance: no Chest pain: no Paresthesias: no Glucose Monitoring: yes  Accucheck frequency: not checking  Fasting glucose:   Post prandial:  Evening:  Before meals: Taking Insulin?: no  Long acting insulin:  Short acting insulin: Blood Pressure Monitoring: not checking Retinal Examination: Up to Date Foot Exam: Up to Date Diabetic Education: Not Completed Pneumovax: Up to Date Influenza: Up to Date Aspirin: no  Relevant past medical, surgical, family and social history reviewed and updated as indicated. Interim medical history since our last visit reviewed. Allergies and medications reviewed and updated.  Review of Systems  Eyes:  Negative for visual disturbance.  Respiratory:  Negative for chest tightness and shortness of breath.   Cardiovascular:  Negative for chest pain, palpitations and leg swelling.  Endocrine: Negative for polydipsia and polyuria.  Neurological:  Negative for dizziness,  light-headedness, numbness and headaches.    Per HPI unless specifically indicated above     Objective:    BP 103/70   Pulse 78   Temp 98 F (36.7 C) (Oral)   Wt 151 lb 1.6 oz (68.5 kg)   LMP  (LMP Unknown)   BMI 28.55 kg/m   Wt Readings from Last 3 Encounters:  04/24/23 151 lb 1.6 oz (68.5 kg)  12/05/22 160 lb (72.6 kg)  10/24/22 163 lb 12.8 oz (74.3 kg)    Physical Exam Vitals and nursing note reviewed.  Constitutional:      General: She is not in acute distress.    Appearance: Normal appearance. She is not ill-appearing, toxic-appearing or diaphoretic.  HENT:     Head: Normocephalic.     Right Ear: External ear normal.     Left Ear: External ear normal.     Nose: Nose normal.     Mouth/Throat:     Mouth: Mucous membranes are moist.     Pharynx: Oropharynx is clear.  Eyes:     General:        Right eye: No discharge.        Left eye: No discharge.     Extraocular Movements: Extraocular movements intact.     Conjunctiva/sclera: Conjunctivae normal.     Pupils: Pupils are equal, round, and reactive to light.  Cardiovascular:     Rate and Rhythm: Normal rate and regular rhythm.     Heart sounds: No murmur heard. Pulmonary:     Effort: Pulmonary effort is normal. No respiratory distress.     Breath sounds: Normal breath sounds. No wheezing or rales.  Musculoskeletal:     Cervical back:  Normal range of motion and neck supple.  Skin:    General: Skin is warm and dry.     Capillary Refill: Capillary refill takes less than 2 seconds.  Neurological:     General: No focal deficit present.     Mental Status: She is alert and oriented to person, place, and time. Mental status is at baseline.  Psychiatric:        Mood and Affect: Mood normal.        Behavior: Behavior normal.        Thought Content: Thought content normal.        Judgment: Judgment normal.     Results for orders placed or performed during the hospital encounter of 12/05/22  Glucose, capillary   Result Value Ref Range   Glucose-Capillary 100 (H) 70 - 99 mg/dL      Assessment & Plan:   Problem List Items Addressed This Visit       Cardiovascular and Mediastinum   Hypertension - Primary    Chronic.  Controlled.  Continue with current medication regimen of Lisinopril 2.5mg  daily.  Refills have already been sent.  Labs ordered today.  Return to clinic in 6 months for reevaluation.  Call sooner if concerns arise.        Relevant Orders   Comp Met (CMET)     Endocrine   Type 2 diabetes mellitus with diabetic neuropathy, without long-term current use of insulin (HCC)    Chronic.  Controlled.  Continue with current medication regimen of Mounjaro 5mg  weekly.  Having trouble finding the medication.  Last A1c in November was 5.8%.  Eye exam up to date.  Labs ordered today.  Return to clinic in 6 months for reevaluation.  Call sooner if concerns arise.        Relevant Medications   metFORMIN (GLUCOPHAGE) 500 MG tablet   Other Relevant Orders   HgB A1c     Other   Hyperlipidemia    Chronic.  Controlled.  Continue with current medication regimen of Rosuvastatin 5mg  daily.  Labs ordered today.  Return to clinic in 6 months for reevaluation.  Call sooner if concerns arise.        Relevant Orders   Lipid Profile   Obesity    Recommended eating smaller high protein, low fat meals more frequently and exercising 30 mins a day 5 times a week with a goal of 10-15lb weight loss in the next 3 months.       Relevant Medications   metFORMIN (GLUCOPHAGE) 500 MG tablet   Other Visit Diagnoses     Need for shingles vaccine       Relevant Orders   Zoster Recombinant (Shingrix )        Follow up plan: Return in about 6 months (around 10/25/2023) for Physical and Fasting labs.

## 2023-04-24 NOTE — Assessment & Plan Note (Signed)
Recommended eating smaller high protein, low fat meals more frequently and exercising 30 mins a day 5 times a week with a goal of 10-15lb weight loss in the next 3 months.  

## 2023-04-24 NOTE — Assessment & Plan Note (Signed)
Chronic.  Controlled.  Continue with current medication regimen of Lisinopril 2.5mg  daily.  Refills have already been sent.  Labs ordered today.  Return to clinic in 6 months for reevaluation.  Call sooner if concerns arise.

## 2023-04-24 NOTE — Assessment & Plan Note (Signed)
Chronic.  Controlled.  Continue with current medication regimen of Mounjaro 5mg  weekly.  Having trouble finding the medication.  Last A1c in November was 5.8%.  Eye exam up to date.  Labs ordered today.  Return to clinic in 6 months for reevaluation.  Call sooner if concerns arise.

## 2023-04-25 LAB — COMPREHENSIVE METABOLIC PANEL
ALT: 38 IU/L — ABNORMAL HIGH (ref 0–32)
AST: 29 IU/L (ref 0–40)
Albumin/Globulin Ratio: 2.4 — ABNORMAL HIGH (ref 1.2–2.2)
Albumin: 4 g/dL (ref 3.9–4.9)
Alkaline Phosphatase: 83 IU/L (ref 44–121)
BUN/Creatinine Ratio: 23 (ref 9–23)
BUN: 13 mg/dL (ref 6–24)
Bilirubin Total: 0.3 mg/dL (ref 0.0–1.2)
CO2: 24 mmol/L (ref 20–29)
Calcium: 9.2 mg/dL (ref 8.7–10.2)
Chloride: 106 mmol/L (ref 96–106)
Creatinine, Ser: 0.56 mg/dL — ABNORMAL LOW (ref 0.57–1.00)
Globulin, Total: 1.7 g/dL (ref 1.5–4.5)
Glucose: 76 mg/dL (ref 70–99)
Potassium: 4.3 mmol/L (ref 3.5–5.2)
Sodium: 143 mmol/L (ref 134–144)
Total Protein: 5.7 g/dL — ABNORMAL LOW (ref 6.0–8.5)
eGFR: 111 mL/min/{1.73_m2} (ref >59)

## 2023-04-25 LAB — HEMOGLOBIN A1C
Est. average glucose Bld gHb Est-mCnc: 120 mg/dL
Hgb A1c MFr Bld: 5.8 % — ABNORMAL HIGH (ref 4.8–5.6)

## 2023-04-25 LAB — LIPID PANEL
Chol/HDL Ratio: 2.3 ratio (ref 0.0–4.4)
Cholesterol, Total: 135 mg/dL (ref 100–199)
HDL: 58 mg/dL (ref >39)
LDL Chol Calc (NIH): 65 mg/dL (ref 0–99)
Triglycerides: 53 mg/dL (ref 0–149)
VLDL Cholesterol Cal: 12 mg/dL (ref 5–40)

## 2023-04-27 NOTE — Progress Notes (Signed)
Hi Berma. It was nice to see you last week.  Your lab work looks good.  Your A1c is well controlled at 5.8%.  Keep up the good work.  No concerns at this time. Continue with your current medication regimen.  Follow up as discussed.  Please let me know if you have any questions.

## 2023-05-19 ENCOUNTER — Encounter: Payer: Self-pay | Admitting: Nurse Practitioner

## 2023-05-20 DIAGNOSIS — M7541 Impingement syndrome of right shoulder: Secondary | ICD-10-CM | POA: Diagnosis not present

## 2023-07-21 ENCOUNTER — Other Ambulatory Visit: Payer: Self-pay | Admitting: Nurse Practitioner

## 2023-07-22 NOTE — Telephone Encounter (Signed)
Too soon. Filled 04/21/23 #90, 1RF. Requested Prescriptions  Pending Prescriptions Disp Refills   rosuvastatin (CRESTOR) 5 MG tablet [Pharmacy Med Name: ROSUVASTATIN CALCIUM 5 MG TAB] 90 tablet 1    Sig: TAKE 1 TABLET (5 MG TOTAL) BY MOUTH DAILY.     Cardiovascular:  Antilipid - Statins 2 Failed - 07/21/2023  6:18 PM      Failed - Cr in normal range and within 360 days    Creatinine  Date Value Ref Range Status  04/29/2013 0.54 (L) 0.60 - 1.30 mg/dL Final   Creatinine, Ser  Date Value Ref Range Status  04/24/2023 0.56 (L) 0.57 - 1.00 mg/dL Final         Failed - Lipid Panel in normal range within the last 12 months    Cholesterol, Total  Date Value Ref Range Status  04/24/2023 135 100 - 199 mg/dL Final   LDL Chol Calc (NIH)  Date Value Ref Range Status  04/24/2023 65 0 - 99 mg/dL Final   HDL  Date Value Ref Range Status  04/24/2023 58 >39 mg/dL Final   Triglycerides  Date Value Ref Range Status  04/24/2023 53 0 - 149 mg/dL Final         Passed - Patient is not pregnant      Passed - Valid encounter within last 12 months    Recent Outpatient Visits           2 months ago Primary hypertension   [place] [facility] [name], NP   9 months ago Annual physical exam   [place] [facility] [name], NP   1 year ago Primary hypertension   [facility] [name], NP   1 year ago Type 2 diabetes mellitus with diabetic neuropathy, without long-term current use of insulin [facility])   [facility] [name], NP   1 year ago Type 2 diabetes mellitus without complication, without long-term current use of insulin (HCC)   [place] [facility] [name], NP       Future Appointments             In 3 months [name], NP [place]

## 2023-07-31 ENCOUNTER — Ambulatory Visit: Payer: BC Managed Care – PPO | Admitting: Physician Assistant

## 2023-07-31 ENCOUNTER — Encounter: Payer: Self-pay | Admitting: Physician Assistant

## 2023-07-31 VITALS — BP 102/69 | HR 77 | Ht 61.0 in | Wt 146.8 lb

## 2023-07-31 DIAGNOSIS — B9689 Other specified bacterial agents as the cause of diseases classified elsewhere: Secondary | ICD-10-CM | POA: Diagnosis not present

## 2023-07-31 DIAGNOSIS — R300 Dysuria: Secondary | ICD-10-CM | POA: Diagnosis not present

## 2023-07-31 DIAGNOSIS — N760 Acute vaginitis: Secondary | ICD-10-CM | POA: Diagnosis not present

## 2023-07-31 LAB — URINALYSIS, ROUTINE W REFLEX MICROSCOPIC
Bilirubin, UA: NEGATIVE
Glucose, UA: NEGATIVE
Ketones, UA: NEGATIVE
Leukocytes,UA: NEGATIVE
Nitrite, UA: NEGATIVE
Protein,UA: NEGATIVE
RBC, UA: NEGATIVE
Specific Gravity, UA: >1.03 — ABNORMAL HIGH (ref 1.005–1.030)
Urobilinogen, Ur: 0.2 mg/dL (ref 0.2–1.0)
pH, UA: 5.5 (ref 5.0–7.5)

## 2023-07-31 LAB — WET PREP FOR TRICH, YEAST, CLUE
Clue Cell Exam: POSITIVE — AB
Trichomonas Exam: NEGATIVE
Yeast Exam: NEGATIVE

## 2023-07-31 MED ORDER — METRONIDAZOLE 500 MG PO TABS: 500.0000 mg | ORAL_TABLET | Freq: Two times a day (BID) | ORAL | 0 refills | Status: DC

## 2023-07-31 NOTE — Progress Notes (Signed)
Acute Office Visit   Patient: Chelsea Mayer   DOB: 02/29/1971   51 y.o. Female  MRN: [id number] Visit Date: 07/31/2023  Today's healthcare provider: Oswaldo Conroy Zeah Germano, PA-C  Introduced myself to the patient as a Secondary school teacher and provided education on APPs in clinical practice.    Chief Complaint  Patient presents with   Urinary Tract Infection    Patient says she first feeling symptoms on Sunday. Patient says she having vaginal itching, urinary frequency and burning with urination. Patient says she has not changed any detergents or soaps recently. Patient declines any over the counter medications.    Urinary Frequency   Vaginal Itching   Dysuria   Subjective    HPI HPI     Urinary Tract Infection    Additional comments: Patient says she first feeling symptoms on Sunday. Patient says she having vaginal itching, urinary frequency and burning with urination. Patient says she has not changed any detergents or soaps recently. Patient declines any over the counter medications.       Last edited by Malen Gauze, CMA on 07/31/2023  3:41 PM.         Concern for UTI Symptoms started around Sunday  She states she has been having urinary urgency along with itching and burning in vaginal area She reports itching and burning were present even when not urinating She denies fevers, chills, suprapubic pain, flank pain   She also reports concerns for decreased Libido  She states over the past 4 years she has noticed gradually decreased libido  She denies pain with intercourse but states she is not very interested in it anymore She would like to address this as her husband still wishes for intimacy   Medications: Outpatient Medications Prior to Visit  Medication Sig   lisinopril (ZESTRIL) 2.5 MG tablet TAKE 1 TABLET BY MOUTH EVERY DAY   metFORMIN (GLUCOPHAGE) 500 MG tablet Take 2 tablets (1,000 mg total) by mouth 2 (two) times daily with a meal.   rosuvastatin (CRESTOR) 5 MG tablet  TAKE 1 TABLET (5 MG TOTAL) BY MOUTH DAILY.   tirzepatide (MOUNJARO) 5 MG/0.5ML Pen INJECT 5 MG SUBCUTANEOUSLY WEEKLY   No facility-administered medications prior to visit.    Review of Systems  Genitourinary:  Positive for dysuria, urgency and vaginal pain. Negative for decreased urine volume, difficulty urinating, dyspareunia, flank pain, hematuria, vaginal bleeding and vaginal discharge.  Neurological:  Positive for headaches.        Objective    BP 102/69   Pulse 77   Ht 5\' 1"  (1.549 m)   Wt 146 lb 12.8 oz (66.6 kg)   SpO2 97%   BMI 27.74 kg/m     Physical Exam Vitals reviewed.  Constitutional:      General: She is awake.     Appearance: Normal appearance. She is well-developed and well-groomed.  HENT:     Head: Normocephalic and atraumatic.  Pulmonary:     Effort: Pulmonary effort is normal.  Musculoskeletal:     Cervical back: Normal range of motion.  Neurological:     Mental Status: She is alert.  Psychiatric:        Attention and Perception: Attention and perception normal.        Mood and Affect: Mood and affect normal.        Speech: Speech normal.        Behavior: Behavior normal. Behavior is cooperative.       No  results found for any visits on 07/31/23.  Assessment & Plan      Return in about 4 weeks (around 08/28/2023) for decreased libido .     Problem List Items Addressed This Visit   None Visit Diagnoses     BV (bacterial vaginosis)    -  Primary Acute, new concern Reports she has had vulvovaginal pain and itching along with increased urinary frequency since Sun  UA was negative today- results reviewed during apt Wet prep was positive for clue cells- will treat with Flagyl for BV Discussed results and regimen with patient along with side effects and contraindications for concomitant Flagyl and alcohol use Pt voiced agreement and understanding Follow up as needed for persistent or progressing symptoms      Relevant Medications    metroNIDAZOLE (FLAGYL) 500 MG tablet   Dysuria       Relevant Orders   Urinalysis, Routine w reflex microscopic   WET PREP FOR TRICH, YEAST, CLUE        Return in about 4 weeks (around 08/28/2023) for decreased libido .   I, Bernie Ransford E Laquincy Eastridge, PA-C, have reviewed all documentation for this visit. The documentation on 07/31/23 for the exam, diagnosis, procedures, and orders are all accurate and complete.   Jacquelin Hawking, MHS, PA-C [facility]

## 2023-08-04 NOTE — Progress Notes (Signed)
Results reviewed during apt

## 2023-08-10 ENCOUNTER — Encounter: Payer: Self-pay | Admitting: Physician Assistant

## 2023-08-31 ENCOUNTER — Ambulatory Visit: Payer: BC Managed Care – PPO | Admitting: Family Medicine

## 2023-09-01 ENCOUNTER — Encounter: Payer: Self-pay | Admitting: Physician Assistant

## 2023-09-01 ENCOUNTER — Ambulatory Visit: Payer: BC Managed Care – PPO | Admitting: Physician Assistant

## 2023-09-01 VITALS — BP 110/71 | HR 77 | Ht 61.0 in | Wt 145.0 lb

## 2023-09-01 DIAGNOSIS — N898 Other specified noninflammatory disorders of vagina: Secondary | ICD-10-CM | POA: Diagnosis not present

## 2023-09-01 DIAGNOSIS — R6882 Decreased libido: Secondary | ICD-10-CM | POA: Diagnosis not present

## 2023-09-01 DIAGNOSIS — R1011 Right upper quadrant pain: Secondary | ICD-10-CM

## 2023-09-01 LAB — URINALYSIS, ROUTINE W REFLEX MICROSCOPIC
Bilirubin, UA: NEGATIVE
Glucose, UA: NEGATIVE
Ketones, UA: NEGATIVE
Leukocytes,UA: NEGATIVE
Nitrite, UA: NEGATIVE
Protein,UA: NEGATIVE
RBC, UA: NEGATIVE
Specific Gravity, UA: 1.015 (ref 1.005–1.030)
Urobilinogen, Ur: 0.2 mg/dL (ref 0.2–1.0)
pH, UA: 6 (ref 5.0–7.5)

## 2023-09-01 LAB — WET PREP FOR TRICH, YEAST, CLUE
Clue Cell Exam: NEGATIVE
Trichomonas Exam: NEGATIVE
Yeast Exam: NEGATIVE

## 2023-09-01 NOTE — Progress Notes (Signed)
Acute Office Visit   Patient: Chelsea Mayer   DOB: [birth year]   52 y.o. Female  MRN: [id number] Visit Date: 09/01/2023  Today's healthcare provider: Oswaldo Conroy Amalea Ottey, PA-C  Introduced myself to the patient as a Secondary school teacher and provided education on APPs in clinical practice.    Chief Complaint  Patient presents with   Decreased Libido    Patient is here for a follow up visit with provider.    Abdominal Pain    Patient says yesterday when she woke up, it felt like she was being squeezed from the inside and the discomfort was located underneath her breast. Patient says when she went to have a bowel movement this morning it was black.    Subjective    HPI HPI     Decreased Libido    Additional comments: Patient is here for a follow up visit with provider.         Abdominal Pain    Additional comments: Patient says yesterday when she woke up, it felt like she was being squeezed from the inside and the discomfort was located underneath her breast. Patient says when she went to have a bowel movement this morning it was black.       Last edited by Malen Gauze, CMA on 09/01/2023  9:52 AM.       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    She reports after taking the Flagyl her symptoms seemed to resolve but came back after 5 days Came back about 2 days ago  She reports she was recently on vacation in [place] but did not have symptoms  while she was there She states she was having some burning and itching - today it is not bothering her that much  Low libido She denies concerns regarding this She reports her husband is still desiring intercourse but she does not have any interest in it  We discussed medications like Addyi and informed her that insurance may not assist with covering this - she declines starting this today    Medications: Outpatient Medications Prior to Visit  Medication Sig   lisinopril (ZESTRIL) 2.5 MG tablet TAKE 1 TABLET BY MOUTH EVERY DAY   metFORMIN (GLUCOPHAGE) 500 MG tablet Take 2 tablets (1,000 mg total) by mouth 2 (two) times daily with a meal.   metroNIDAZOLE (FLAGYL) 500 MG tablet Take 1 tablet (500 mg total) by mouth 2 (two) times daily.   OZEMPIC, 0.25 OR 0.5 MG/DOSE, 2 MG/3ML SOPN Inject into the skin.   rosuvastatin (CRESTOR) 5 MG tablet TAKE 1 TABLET (5 MG TOTAL) BY MOUTH DAILY.   tirzepatide [facility]) 5 MG/0.5ML Pen INJECT 5 MG SUBCUTANEOUSLY WEEKLY (Patient not taking: Reported on 09/01/2023)   No facility-administered medications prior to visit.    Review of Systems  Constitutional:  Negative for chills, diaphoresis, fatigue and fever.  HENT:  Positive for postnasal drip and sinus pressure.   Gastrointestinal:  Positive for abdominal pain. Negative for blood in stool, diarrhea, nausea and vomiting.        Objective    BP 110/71   Pulse 77   Ht 5\' 1"  (1.549 m)   Wt 145 lb (65.8 kg)   SpO2 97%   BMI 27.40 kg/m     Physical Exam Vitals reviewed.  Constitutional:      General: She is awake.     Appearance: Normal appearance. She is well-developed and well-groomed.  HENT:     Head: Normocephalic and atraumatic.  Pulmonary:     Effort: Pulmonary effort is normal.  Abdominal:     General: Abdomen is flat. Bowel sounds are normal.     Palpations: Abdomen is soft.     Tenderness: There is abdominal tenderness in the right upper quadrant and epigastric area. There is no  guarding or rebound. Positive signs include Murphy's sign.  Skin:    General: Skin is warm and dry.  Neurological:     General: No focal deficit present.     Mental Status: She is alert and oriented to person, place, and time.  Psychiatric:        Mood and Affect: Mood normal.        Behavior: Behavior normal. Behavior is cooperative.       Results for orders placed or performed in visit on 09/01/23  WET PREP FOR TRICH, YEAST, CLUE   Specimen: Urine   Urine  Result Value Ref Range   Trichomonas Exam Negative Negative   Yeast Exam Negative Negative   Clue Cell Exam Negative Negative  Urinalysis, Routine w reflex microscopic  Result Value Ref Range   Specific Gravity, UA 1.015 1.005 - 1.030   pH, UA 6.0 5.0 - 7.5   Color, UA Yellow Yellow   Appearance Ur Clear Clear   Leukocytes,UA Negative Negative   Protein,UA Negative Negative/Trace   Glucose, UA Negative Negative   Ketones, UA Negative Negative   RBC, UA Negative Negative   Bilirubin, UA Negative Negative   Urobilinogen, Ur 0.2 0.2 - 1.0 mg/dL   Nitrite, UA Negative Negative   Microscopic Examination Comment     Assessment & Plan      No follow-ups on file.       Problem List Items Addressed This Visit       Other   Low libido    Likely chronic, ongoing Patient reports decreased interest in sexual intercourse but denies pain with sex or distress over decreased interest She states her husband still desires intercourse- recommend couples discussion/counseling for them to work through this Discussed with her that low libido can sometimes manifest with aging and there aren't many treatment options available at this time She is amenable with discussing this further with her husband and does not wish to pursue medications right now Follow up as needed for persistent or progressing symptoms        Other Visit Diagnoses     Right upper quadrant abdominal pain    -  Primary Acute, new concern She reports  abdominal pain in RUQ since yesterday that does not seem to be improving or worsening  Tenderness present on PE in RUQ with positive Murphy's sign- patient has had gallbladder removed  Will check CMP, CBC, lipase and amylase for potential rule out - resutls to dictate further management  Recommend she continues Pepto and Pepcid for potential GERD along with bland  diet  Reviewed that Pepto can cause black stools but will check CBC for anemia as well Will also check UA and Wet prep today given recurrent vaginal itching and irritation Follow up as needed for persistent or progressing symptoms      Relevant Orders   CBC w/Diff   Comp Met (CMET)   WET PREP FOR TRICH, YEAST, CLUE (Completed)   Urinalysis, Routine w reflex microscopic (Completed)   Lipase   Amylase   Vaginal itching     Wet prep and UA were negative today  Results sent to patient via mychart.     Relevant Orders   WET PREP FOR TRICH, YEAST, CLUE (Completed)   Urinalysis, Routine w reflex microscopic (Completed)        No follow-ups on file.   I, Julena Barbour E Krikor Willet, PA-C, have reviewed all documentation for this visit. The documentation on 09/01/23 for the exam, diagnosis, procedures, and orders are all accurate and complete.   Jacquelin Hawking, MHS, PA-C [facility]

## 2023-09-01 NOTE — Assessment & Plan Note (Signed)
Likely chronic, ongoing Patient reports decreased interest in sexual intercourse but denies pain with sex or distress over decreased interest She states her husband still desires intercourse- recommend couples discussion/counseling for them to work through this Discussed with her that low libido can sometimes manifest with aging and there aren't many treatment options available at this time She is amenable with discussing this further with her husband and does not wish to pursue medications right now Follow up as needed for persistent or progressing symptoms

## 2023-09-01 NOTE — Progress Notes (Signed)
Your cervicovaginal swab and urinalysis were negative It does not appear that you have a UTI or BV, yeast infection or trichomonas at this time

## 2023-09-02 ENCOUNTER — Encounter: Payer: Self-pay | Admitting: Physician Assistant

## 2023-09-02 LAB — CBC WITH DIFFERENTIAL/PLATELET
Basophils Absolute: 0 10*3/uL (ref 0.0–0.2)
Basos: 1 %
EOS (ABSOLUTE): 0.1 10*3/uL (ref 0.0–0.4)
Eos: 1 %
Hematocrit: 45.2 % (ref 34.0–46.6)
Hemoglobin: 14.4 g/dL (ref 11.1–15.9)
Immature Grans (Abs): 0 10*3/uL (ref 0.0–0.1)
Immature Granulocytes: 0 %
Lymphocytes Absolute: 2 10*3/uL (ref 0.7–3.1)
Lymphs: 24 %
MCH: 29.9 pg (ref 26.6–33.0)
MCHC: 31.9 g/dL (ref 31.5–35.7)
MCV: 94 fL (ref 79–97)
Monocytes Absolute: 0.6 10*3/uL (ref 0.1–0.9)
Monocytes: 7 %
Neutrophils Absolute: 5.6 10*3/uL (ref 1.4–7.0)
Neutrophils: 67 %
Platelets: 300 10*3/uL (ref 150–450)
RBC: 4.82 x10E6/uL (ref 3.77–5.28)
RDW: 13.1 % (ref 11.7–15.4)
WBC: 8.3 10*3/uL (ref 3.4–10.8)

## 2023-09-02 LAB — COMPREHENSIVE METABOLIC PANEL
ALT: 48 IU/L — ABNORMAL HIGH (ref 0–32)
AST: 34 IU/L (ref 0–40)
Albumin: 4.4 g/dL (ref 3.9–4.9)
Alkaline Phosphatase: 112 IU/L (ref 44–121)
BUN/Creatinine Ratio: 14 (ref 9–23)
BUN: 9 mg/dL (ref 6–24)
Bilirubin Total: 0.4 mg/dL (ref 0.0–1.2)
CO2: 22 mmol/L (ref 20–29)
Calcium: 9.5 mg/dL (ref 8.7–10.2)
Chloride: 102 mmol/L (ref 96–106)
Creatinine, Ser: 0.65 mg/dL (ref 0.57–1.00)
Globulin, Total: 2.3 g/dL (ref 1.5–4.5)
Glucose: 86 mg/dL (ref 70–99)
Potassium: 4.2 mmol/L (ref 3.5–5.2)
Sodium: 140 mmol/L (ref 134–144)
Total Protein: 6.7 g/dL (ref 6.0–8.5)
eGFR: 107 mL/min/{1.73_m2} (ref >59)

## 2023-09-02 LAB — LIPASE: Lipase: 20 U/L (ref 14–72)

## 2023-09-02 LAB — AMYLASE: Amylase: 51 U/L (ref 31–110)

## 2023-09-02 NOTE — Progress Notes (Signed)
Your labs appear to be in normal ranges at this time. Your pancreas labs did not show any elevations at this time. It is possible your abdominal pain may be from reflux or increased stomach acid so I think we should proceed with the Pepcid and pepto as discussed yesterday along with a bland diet. If you have any changes or further concerns please let us know.

## 2023-09-04 MED ORDER — PHENAZOPYRIDINE HCL 100 MG PO TABS: 100.0000 mg | ORAL_TABLET | Freq: Three times a day (TID) | ORAL | 0 refills | Status: DC | PRN

## 2023-10-29 NOTE — Progress Notes (Signed)
BP 131/84 (BP Location: Left Arm, Patient Position: Sitting, Cuff Size: Normal)   Pulse 83   Temp 98 F (36.7 C) (Oral)   Ht 5' 1.5" (1.562 m)   Wt 149 lb 3.2 oz (67.7 kg)   SpO2 96%   BMI 27.73 kg/m    Subjective:    Patient ID: Chelsea Mayer, female    DOB: 1971-10-26, 52 y.o.   MRN: [id number]  HPI: Chelsea Mayer is a 52 y.o. female presenting on 10/30/2023 for comprehensive medical examination. Current medical complaints include:none  She currently lives with: Menopausal Symptoms: yes  HYPERTENSION / HYPERLIPIDEMIA Satisfied with current treatment? yes Duration of hypertension: years BP monitoring frequency: not checking BP range:  BP medication side effects: no Past BP meds: none Duration of hyperlipidemia: years Cholesterol medication side effects: no Cholesterol supplements: none Past cholesterol medications: none Medication compliance: excellent compliance Aspirin: no Recent stressors: no Recurrent headaches: no Visual changes: no Palpitations: no Dyspnea: no Chest pain: no Lower extremity edema: no Dizzy/lightheaded: no  DIABETES Patient is doing well with Ozempic.  She is currently taking 0.5mg  weekly.  She would like to go back to [place].  She is also taking Metformin 1000mg  BID. Hypoglycemic episodes:no Polydipsia/polyuria: no Visual disturbance: no Chest pain: no Paresthesias: no Glucose Monitoring: no  Accucheck frequency: Not Checking  Fasting glucose:  Post prandial:  Evening:  Before meals: Taking Insulin?: no  Long acting insulin:  Short acting insulin: Blood Pressure Monitoring: not checking Retinal Examination: Scheduled for this month Foot Exam: Up to Date Diabetic Education: Not Completed Pneumovax:  Given today Influenza: Up to Date Aspirin: no  Depression Screen done today and results listed below:     09/01/2023    9:54 AM 07/31/2023    3:42 PM 04/24/2023    8:11 AM 04/23/2022    1:12 PM 01/28/2022    8:53 AM  Depression  screen PHQ 2/9  Decreased Interest 1 2 1 1 1   Down, Depressed, Hopeless 0 0 0 0 0  PHQ - 2 Score 1 2 1 1 1   Altered sleeping 1 2 1  0 0  Tired, decreased energy 1 2 1 1 1   Change in appetite 0 0 0 0 0  Feeling bad or failure about yourself  0 0 0 0 0  Trouble concentrating 0 0 0 0 0  Moving slowly or fidgety/restless 0 0  0 0  Suicidal thoughts 0 0 0 0 0  PHQ-9 Score 3 6 3 2 2   Difficult doing work/chores Not difficult at all Not difficult at all  Not difficult at all Not difficult at all    The patient does not have a history of falls. I did complete a risk assessment for falls. A plan of care for falls was documented.   Past Medical History:  Past Medical History:  Diagnosis Date   Hyperlipidemia    Hypertension     Surgical History:  Past Surgical History:  Procedure Laterality Date   CHOLECYSTECTOMY     COLONOSCOPY WITH PROPOFOL N/A 12/05/2022   Procedure: COLONOSCOPY WITH PROPOFOL;  Surgeon: Wyline Mood, MD;  Location: [facility] ENDOSCOPY;  Service: Gastroenterology;  Laterality: N/A;   GASTRIC BYPASS  2009    Medications:  No current outpatient medications on file prior to visit.   No current facility-administered medications on file prior to visit.    Allergies:  No Known Allergies  Social History:  Social History   Socioeconomic History   Marital status: Married  Spouse name: Not on file   Number of children: Not on file   Years of education: Not on file   Highest education level: Not on file  Occupational History   Not on file  Tobacco Use   Smoking status: Former    Types: Cigarettes   Smokeless tobacco: Never   Tobacco comments:    States she quit a couple of months ago, unsure of date.   Vaping Use   Vaping status: Never Used  Substance and Sexual Activity   Alcohol use: Yes    Comment: occasional   Drug use: No   Sexual activity: Yes  Other Topics Concern   Not on file  Social History Narrative   Not on file   Social Determinants of  Health   Financial Resource Strain: Not on file  Food Insecurity: Not on file  Transportation Needs: Not on file  Physical Activity: Not on file  Stress: Not on file  Social Connections: Not on file  Intimate Partner Violence: Not on file   Social History   Tobacco Use  Smoking Status Former   Types: Cigarettes  Smokeless Tobacco Never  Tobacco Comments   States she quit a couple of months ago, unsure of date.    Social History   Substance and Sexual Activity  Alcohol Use Yes   Comment: occasional    Family History:  Family History  Problem Relation Age of Onset   Breast cancer Mother 30   Cancer Mother        breast   Diabetes Father    Allergies Daughter     Past medical history, surgical history, medications, allergies, family history and social history reviewed with patient today and changes made to appropriate areas of the chart.   Review of Systems  HENT:         Denies vision changes.  Eyes:  Negative for blurred vision and double vision.  Respiratory:  Negative for shortness of breath.   Cardiovascular:  Negative for chest pain, palpitations and leg swelling.  Neurological:  Negative for dizziness, tingling and headaches.  Endo/Heme/Allergies:  Negative for polydipsia.       Denies Polyuria   All other ROS negative except what is listed above and in the HPI.      Objective:    BP 131/84 (BP Location: Left Arm, Patient Position: Sitting, Cuff Size: Normal)   Pulse 83   Temp 98 F (36.7 C) (Oral)   Ht 5' 1.5" (1.562 m)   Wt 149 lb 3.2 oz (67.7 kg)   SpO2 96%   BMI 27.73 kg/m   Wt Readings from Last 3 Encounters:  10/30/23 149 lb 3.2 oz (67.7 kg)  09/01/23 145 lb (65.8 kg)  07/31/23 146 lb 12.8 oz (66.6 kg)    Physical Exam Vitals and nursing note reviewed.  Constitutional:      General: She is awake. She is not in acute distress.    Appearance: Normal appearance. She is well-developed. She is not ill-appearing.  HENT:     Head:  Normocephalic and atraumatic.     Right Ear: Hearing, tympanic membrane, ear canal and external ear normal. No drainage.     Left Ear: Hearing, tympanic membrane, ear canal and external ear normal. No drainage.     Nose: Nose normal.     Right Sinus: No maxillary sinus tenderness or frontal sinus tenderness.     Left Sinus: No maxillary sinus tenderness or frontal sinus tenderness.  Mouth/Throat:     Mouth: Mucous membranes are moist.     Pharynx: Oropharynx is clear. Uvula midline. No pharyngeal swelling, oropharyngeal exudate or posterior oropharyngeal erythema.  Eyes:     General: Lids are normal.        Right eye: No discharge.        Left eye: No discharge.     Extraocular Movements: Extraocular movements intact.     Conjunctiva/sclera: Conjunctivae normal.     Pupils: Pupils are equal, round, and reactive to light.     Visual Fields: Right eye visual fields normal and left eye visual fields normal.  Neck:     Thyroid: No thyromegaly.     Vascular: No carotid bruit.     Trachea: Trachea normal.  Cardiovascular:     Rate and Rhythm: Normal rate and regular rhythm.     Heart sounds: Normal heart sounds. No murmur heard.    No gallop.  Pulmonary:     Effort: Pulmonary effort is normal. No accessory muscle usage or respiratory distress.     Breath sounds: Normal breath sounds.  Chest:  Breasts:    Right: Normal.     Left: Normal.  Abdominal:     General: Bowel sounds are normal.     Palpations: Abdomen is soft. There is no hepatomegaly or splenomegaly.     Tenderness: There is no abdominal tenderness.  Musculoskeletal:        General: Normal range of motion.     Cervical back: Normal range of motion and neck supple.     Right lower leg: No edema.     Left lower leg: No edema.  Lymphadenopathy:     Head:     Right side of head: No submental, submandibular, tonsillar, preauricular or posterior auricular adenopathy.     Left side of head: No submental, submandibular,  tonsillar, preauricular or posterior auricular adenopathy.     Cervical: No cervical adenopathy.     Upper Body:     Right upper body: No supraclavicular, axillary or pectoral adenopathy.     Left upper body: No supraclavicular, axillary or pectoral adenopathy.  Skin:    General: Skin is warm and dry.     Capillary Refill: Capillary refill takes less than 2 seconds.     Findings: No rash.  Neurological:     Mental Status: She is alert and oriented to person, place, and time.     Gait: Gait is intact.  Psychiatric:        Attention and Perception: Attention normal.        Mood and Affect: Mood normal.        Speech: Speech normal.        Behavior: Behavior normal. Behavior is cooperative.        Thought Content: Thought content normal.        Judgment: Judgment normal.     Results for orders placed or performed in visit on 09/01/23  WET PREP FOR TRICH, YEAST, CLUE   Specimen: Urine   Urine  Result Value Ref Range   Trichomonas Exam Negative Negative   Yeast Exam Negative Negative   Clue Cell Exam Negative Negative  CBC w/Diff  Result Value Ref Range   WBC 8.3 3.4 - 10.8 x10E3/uL   RBC 4.82 3.77 - 5.28 x10E6/uL   Hemoglobin 14.4 11.1 - 15.9 g/dL   Hematocrit 06.3 01.6 - 46.6 %   MCV 94 79 - 97 fL   MCH 29.9 26.6 -  33.0 pg   MCHC 31.9 31.5 - 35.7 g/dL   RDW 21.3 08.6 - 57.8 %   Platelets 300 150 - 450 x10E3/uL   Neutrophils 67 Not Estab. %   Lymphs 24 Not Estab. %   Monocytes 7 Not Estab. %   Eos 1 Not Estab. %   Basos 1 Not Estab. %   Neutrophils Absolute 5.6 1.4 - 7.0 x10E3/uL   Lymphocytes Absolute 2.0 0.7 - 3.1 x10E3/uL   Monocytes Absolute 0.6 0.1 - 0.9 x10E3/uL   EOS (ABSOLUTE) 0.1 0.0 - 0.4 x10E3/uL   Basophils Absolute 0.0 0.0 - 0.2 x10E3/uL   Immature Granulocytes 0 Not Estab. %   Immature Grans (Abs) 0.0 0.0 - 0.1 x10E3/uL  Comp Met (CMET)  Result Value Ref Range   Glucose 86 70 - 99 mg/dL   BUN 9 6 - 24 mg/dL   Creatinine, Ser 4.69 0.57 - 1.00 mg/dL    eGFR 629 >52 WU/XLK/4.40   BUN/Creatinine Ratio 14 9 - 23   Sodium 140 134 - 144 mmol/L   Potassium 4.2 3.5 - 5.2 mmol/L   Chloride 102 96 - 106 mmol/L   CO2 22 20 - 29 mmol/L   Calcium 9.5 8.7 - 10.2 mg/dL   Total Protein 6.7 6.0 - 8.5 g/dL   Albumin 4.4 3.9 - 4.9 g/dL   Globulin, Total 2.3 1.5 - 4.5 g/dL   Bilirubin Total 0.4 0.0 - 1.2 mg/dL   Alkaline Phosphatase 112 44 - 121 IU/L   AST 34 0 - 40 IU/L   ALT 48 (H) 0 - 32 IU/L  Urinalysis, Routine w reflex microscopic  Result Value Ref Range   Specific Gravity, UA 1.015 1.005 - 1.030   pH, UA 6.0 5.0 - 7.5   Color, UA Yellow Yellow   Appearance Ur Clear Clear   Leukocytes,UA Negative Negative   Protein,UA Negative Negative/Trace   Glucose, UA Negative Negative   Ketones, UA Negative Negative   RBC, UA Negative Negative   Bilirubin, UA Negative Negative   Urobilinogen, Ur 0.2 0.2 - 1.0 mg/dL   Nitrite, UA Negative Negative   Microscopic Examination Comment   Lipase  Result Value Ref Range   Lipase 20 14 - 72 U/L  Amylase  Result Value Ref Range   Amylase 51 31 - 110 U/L      Assessment & Plan:   Problem List Items Addressed This Visit       Cardiovascular and Mediastinum   Hypertension    Chronic.  Well controlled.  Has not been taking the Lisinopril.  Discussed benefits of medication, especially in the setting of Type 2 diabetes.  Patient agrees to resume taking medication.  Labs ordered. Follow up in 6 months.  Call sooner if concerns arise.       Relevant Medications   lisinopril (ZESTRIL) 2.5 MG tablet   rosuvastatin (CRESTOR) 5 MG tablet     Endocrine   Type 2 diabetes mellitus with diabetic neuropathy, without long-term current use of insulin (HCC)    Chronic.  Controlled.  Last A1c was 5.8%.  Patient would like to switch from Ozempic back to [facility] now that it is more available.   Continue with current medication regimen of Mounjaro 5mg  weekly.  Eye exam scheduled for this month.  Labs ordered today.   Return to clinic in 6 months for reevaluation.  Call sooner if concerns arise.        Relevant Medications   tirzepatide (MOUNJARO) 5 MG/0.5ML Pen  lisinopril (ZESTRIL) 2.5 MG tablet   metFORMIN (GLUCOPHAGE) 500 MG tablet   rosuvastatin (CRESTOR) 5 MG tablet   Other Relevant Orders   Microalbumin, Urine Waived   HgB A1c     Other   Hyperlipidemia    Chronic.  Has not been taking Rosuvastatin.  Discussed benefits of medication, especially in the setting of type 2 diabetes.  Patient agrees to resume taking.  Labs ordered today. Follow up in 6 months.  Call sooner if concerns arise.       Relevant Medications   lisinopril (ZESTRIL) 2.5 MG tablet   rosuvastatin (CRESTOR) 5 MG tablet   Other Relevant Orders   Lipid panel   RESOLVED: Obesity   Relevant Medications   tirzepatide (MOUNJARO) 5 MG/0.5ML Pen   metFORMIN (GLUCOPHAGE) 500 MG tablet   Other Visit Diagnoses     Annual physical exam    -  Primary   Health maintenance reviewed during visit today.  Labs ordered.  Flu shot given.  Discussed diagnostic mammogram.  PAP and colon cancer screening up to date.   Relevant Orders   CBC with Differential/Platelet   Comprehensive metabolic panel   Lipid panel   TSH   Urinalysis, Routine w reflex microscopic   Microalbumin, Urine Waived   Need for influenza vaccination       Relevant Orders   Flu vaccine trivalent PF, 6mos and older(Flulaval,Afluria,Fluarix,Fluzone)        Follow up plan: Return in about 6 months (around 04/28/2024) for HTN, HLD, DM2 FU.   LABORATORY TESTING:  - Pap smear: pap done  IMMUNIZATIONS:   - Tdap: Tetanus vaccination status reviewed: last tetanus booster within 10 years. - Influenza: Up to date - Pneumovax: Administered today - Prevnar: Not applicable - HPV: Not applicable - Zostavax vaccine: Not applicable  SCREENING: -Mammogram:  Ordered at last visit- reminded patient today   - Colonoscopy:  Discussed at visit. Will wait until 51.   -  Bone Density: Not applicable  -Hearing Test: Not applicable  -Spirometry: Not applicable   PATIENT COUNSELING:   Advised to take 1 mg of folate supplement per day if capable of pregnancy.   Sexuality: Discussed sexually transmitted diseases, partner selection, use of condoms, avoidance of unintended pregnancy  and contraceptive alternatives.   Advised to avoid cigarette smoking.  I discussed with the patient that most people either abstain from alcohol or drink within safe limits (<=14/week and <=4 drinks/occasion for males, <=7/weeks and <= 3 drinks/occasion for females) and that the risk for alcohol disorders and other health effects rises proportionally with the number of drinks per week and how often a drinker exceeds daily limits.  Discussed cessation/primary prevention of drug use and availability of treatment for abuse.   Diet: Encouraged to adjust caloric intake to maintain  or achieve ideal body weight, to reduce intake of dietary saturated fat and total fat, to limit sodium intake by avoiding high sodium foods and not adding table salt, and to maintain adequate dietary potassium and calcium preferably from fresh fruits, vegetables, and low-fat dairy products.    stressed the importance of regular exercise  Injury prevention: Discussed safety belts, safety helmets, smoke detector, smoking near bedding or upholstery.   Dental health: Discussed importance of regular tooth brushing, flossing, and dental visits.    NEXT PREVENTATIVE PHYSICAL DUE IN 1 YEAR. Return in about 6 months (around 04/28/2024) for HTN, HLD, DM2 FU.

## 2023-10-30 ENCOUNTER — Ambulatory Visit (INDEPENDENT_AMBULATORY_CARE_PROVIDER_SITE_OTHER): Payer: BC Managed Care – PPO | Admitting: Nurse Practitioner

## 2023-10-30 ENCOUNTER — Encounter: Payer: Self-pay | Admitting: Nurse Practitioner

## 2023-10-30 VITALS — BP 131/84 | HR 83 | Temp 98.0°F | Ht 61.5 in | Wt 149.2 lb

## 2023-10-30 DIAGNOSIS — I10 Essential (primary) hypertension: Secondary | ICD-10-CM

## 2023-10-30 DIAGNOSIS — E66811 Obesity, class 1: Secondary | ICD-10-CM | POA: Diagnosis not present

## 2023-10-30 DIAGNOSIS — E785 Hyperlipidemia, unspecified: Secondary | ICD-10-CM | POA: Diagnosis not present

## 2023-10-30 DIAGNOSIS — Z0000 Encounter for general adult medical examination without abnormal findings: Secondary | ICD-10-CM

## 2023-10-30 DIAGNOSIS — Z6833 Body mass index (BMI) 33.0-33.9, adult: Secondary | ICD-10-CM

## 2023-10-30 DIAGNOSIS — Z7985 Long-term (current) use of injectable non-insulin antidiabetic drugs: Secondary | ICD-10-CM

## 2023-10-30 DIAGNOSIS — Z23 Encounter for immunization: Secondary | ICD-10-CM

## 2023-10-30 DIAGNOSIS — E6609 Other obesity due to excess calories: Secondary | ICD-10-CM

## 2023-10-30 DIAGNOSIS — E1140 Type 2 diabetes mellitus with diabetic neuropathy, unspecified: Secondary | ICD-10-CM

## 2023-10-30 LAB — URINALYSIS, ROUTINE W REFLEX MICROSCOPIC
Bilirubin, UA: NEGATIVE
Glucose, UA: NEGATIVE
Ketones, UA: NEGATIVE
Leukocytes,UA: NEGATIVE
Nitrite, UA: NEGATIVE
Protein,UA: NEGATIVE
RBC, UA: NEGATIVE
Specific Gravity, UA: 1.02 (ref 1.005–1.030)
Urobilinogen, Ur: 0.2 mg/dL (ref 0.2–1.0)
pH, UA: 6 (ref 5.0–7.5)

## 2023-10-30 MED ORDER — ROSUVASTATIN CALCIUM 5 MG PO TABS: 5.0000 mg | ORAL_TABLET | Freq: Every day | ORAL | 1 refills | Status: DC

## 2023-10-30 MED ORDER — TIRZEPATIDE 5 MG/0.5ML ~~LOC~~ SOAJ: 5.0000 mg | SUBCUTANEOUS | 2 refills | Status: DC

## 2023-10-30 MED ORDER — METFORMIN HCL 500 MG PO TABS: 1000.0000 mg | ORAL_TABLET | Freq: Two times a day (BID) | ORAL | 1 refills | Status: DC

## 2023-10-30 MED ORDER — LISINOPRIL 2.5 MG PO TABS: 2.5000 mg | ORAL_TABLET | Freq: Every day | ORAL | 1 refills | Status: DC

## 2023-10-30 NOTE — Assessment & Plan Note (Signed)
Chronic.  Well controlled.  Has not been taking the Lisinopril.  Discussed benefits of medication, especially in the setting of Type 2 diabetes.  Patient agrees to resume taking medication.  Labs ordered. Follow up in 6 months.  Call sooner if concerns arise.

## 2023-10-30 NOTE — Assessment & Plan Note (Signed)
Chronic.  Has not been taking Rosuvastatin.  Discussed benefits of medication, especially in the setting of type 2 diabetes.  Patient agrees to resume taking.  Labs ordered today. Follow up in 6 months.  Call sooner if concerns arise.

## 2023-10-30 NOTE — Patient Instructions (Signed)
Please call to schedule your mammogram and/or bone density: [facility]  Address: [address], [place], [place] [address] Phone: [phone]  [facility] [place] [address] [place],  [place]  [address] Phone: [phone]   

## 2023-10-30 NOTE — Assessment & Plan Note (Signed)
Chronic.  Controlled.  Last A1c was 5.8%.  Patient would like to switch from Ozempic back to [facility] now that it is more available.   Continue with current medication regimen of Mounjaro 5mg  weekly.  Eye exam scheduled for this month.  Labs ordered today.  Return to clinic in 6 months for reevaluation.  Call sooner if concerns arise.

## 2023-10-31 LAB — CBC WITH DIFFERENTIAL/PLATELET
Basophils Absolute: 0 10*3/uL (ref 0.0–0.2)
Basos: 1 %
EOS (ABSOLUTE): 0.1 10*3/uL (ref 0.0–0.4)
Eos: 3 %
Hematocrit: 40.4 % (ref 34.0–46.6)
Hemoglobin: 13.2 g/dL (ref 11.1–15.9)
Immature Grans (Abs): 0 10*3/uL (ref 0.0–0.1)
Immature Granulocytes: 0 %
Lymphocytes Absolute: 1.4 10*3/uL (ref 0.7–3.1)
Lymphs: 32 %
MCH: 30.2 pg (ref 26.6–33.0)
MCHC: 32.7 g/dL (ref 31.5–35.7)
MCV: 92 fL (ref 79–97)
Monocytes Absolute: 0.4 10*3/uL (ref 0.1–0.9)
Monocytes: 8 %
Neutrophils Absolute: 2.6 10*3/uL (ref 1.4–7.0)
Neutrophils: 56 %
Platelets: 271 10*3/uL (ref 150–450)
RBC: 4.37 x10E6/uL (ref 3.77–5.28)
RDW: 12.5 % (ref 11.7–15.4)
WBC: 4.5 10*3/uL (ref 3.4–10.8)

## 2023-10-31 LAB — LIPID PANEL
Chol/HDL Ratio: 2.5 ratio (ref 0.0–4.4)
Cholesterol, Total: 171 mg/dL (ref 100–199)
HDL: 69 mg/dL (ref >39)
LDL Chol Calc (NIH): 89 mg/dL (ref 0–99)
Triglycerides: 68 mg/dL (ref 0–149)
VLDL Cholesterol Cal: 13 mg/dL (ref 5–40)

## 2023-10-31 LAB — COMPREHENSIVE METABOLIC PANEL
ALT: 26 [IU]/L (ref 0–32)
AST: 19 [IU]/L (ref 0–40)
Albumin: 3.9 g/dL (ref 3.8–4.9)
Alkaline Phosphatase: 104 [IU]/L (ref 44–121)
BUN/Creatinine Ratio: 23 (ref 9–23)
BUN: 14 mg/dL (ref 6–24)
Bilirubin Total: 0.4 mg/dL (ref 0.0–1.2)
CO2: 24 mmol/L (ref 20–29)
Calcium: 9.2 mg/dL (ref 8.7–10.2)
Chloride: 105 mmol/L (ref 96–106)
Creatinine, Ser: 0.61 mg/dL (ref 0.57–1.00)
Globulin, Total: 2.1 g/dL (ref 1.5–4.5)
Glucose: 73 mg/dL (ref 70–99)
Potassium: 4.2 mmol/L (ref 3.5–5.2)
Sodium: 142 mmol/L (ref 134–144)
Total Protein: 6 g/dL (ref 6.0–8.5)
eGFR: 108 mL/min/{1.73_m2} (ref >59)

## 2023-10-31 LAB — HEMOGLOBIN A1C
Est. average glucose Bld gHb Est-mCnc: 120 mg/dL
Hgb A1c MFr Bld: 5.8 % — ABNORMAL HIGH (ref 4.8–5.6)

## 2023-10-31 LAB — TSH: TSH: 1.44 u[IU]/mL (ref 0.450–4.500)

## 2023-11-01 ENCOUNTER — Other Ambulatory Visit: Payer: Self-pay | Admitting: Nurse Practitioner

## 2023-11-02 NOTE — Telephone Encounter (Signed)
Unable to refill per protocol, Rx expired. Discontinued 11/03/22, dose change.  Requested Prescriptions  Pending Prescriptions Disp Refills   OZEMPIC, 0.25 OR 0.5 MG/DOSE, 2 MG/3ML SOPN [Pharmacy Med Name: OZEMPIC 0.25-0.5 MG/DOSE PEN]  1    Sig: INJECT 0.25MG  ONCE A WEEK X 4 WEEKS, THEN INCREASE TO 0.5MG  WEEKLY.     Endocrinology:  Diabetes - GLP-1 Receptor Agonists - semaglutide Failed - 11/01/2023  2:00 AM      Failed - HBA1C in normal range and within 180 days    Hgb A1c MFr Bld  Date Value Ref Range Status  10/30/2023 5.8 (H) 4.8 - 5.6 % Final    Comment:             Prediabetes: 5.7 - 6.4          Diabetes: >6.4          Glycemic control for adults with diabetes: <7.0          Passed - Cr in normal range and within 360 days    Creatinine  Date Value Ref Range Status  04/29/2013 0.54 (L) 0.60 - 1.30 mg/dL Final   Creatinine, Ser  Date Value Ref Range Status  10/30/2023 0.61 0.57 - 1.00 mg/dL Final         Passed - Valid encounter within last 6 months    Recent Outpatient Visits           3 days ago Annual physical exam   [place] [facility] [name], NP   2 months ago Right upper quadrant abdominal pain   [place] [facility] Mecum, Erin E, PA-C   3 months ago BV (bacterial vaginosis)   [place] [facility] Mecum, Oswaldo Conroy, PA-C   6 months ago Primary hypertension   [place] [facility] [name], NP   1 year ago Annual physical exam   [place] [facility] [name], NP       Future Appointments             In 5 months [name], NP  [facility]

## 2024-04-21 LAB — HM DIABETES EYE EXAM

## 2024-04-28 ENCOUNTER — Telehealth: Payer: Self-pay | Admitting: Nurse Practitioner

## 2024-04-28 ENCOUNTER — Ambulatory Visit: Payer: Self-pay | Admitting: Nurse Practitioner

## 2024-04-28 ENCOUNTER — Encounter: Payer: Self-pay | Admitting: Nurse Practitioner

## 2024-04-28 VITALS — BP 122/79 | HR 73 | Ht 61.5 in | Wt 144.6 lb

## 2024-04-28 DIAGNOSIS — E1140 Type 2 diabetes mellitus with diabetic neuropathy, unspecified: Secondary | ICD-10-CM | POA: Diagnosis not present

## 2024-04-28 DIAGNOSIS — I10 Essential (primary) hypertension: Secondary | ICD-10-CM | POA: Diagnosis not present

## 2024-04-28 DIAGNOSIS — E785 Hyperlipidemia, unspecified: Secondary | ICD-10-CM | POA: Diagnosis not present

## 2024-04-28 LAB — MICROALBUMIN, URINE WAIVED
Creatinine, Urine Waived: 50 mg/dL (ref 10–300)
Microalb, Ur Waived: 10 mg/L (ref 0–19)

## 2024-04-28 MED ORDER — ROSUVASTATIN CALCIUM 5 MG PO TABS
5.0000 mg | ORAL_TABLET | Freq: Every day | ORAL | 1 refills | Status: DC
Start: 2024-04-28 — End: 2024-04-28

## 2024-04-28 MED ORDER — ROSUVASTATIN CALCIUM 5 MG PO TABS: 5.0000 mg | ORAL_TABLET | Freq: Every day | ORAL | 1 refills | Status: DC

## 2024-04-28 MED ORDER — TIRZEPATIDE 5 MG/0.5ML ~~LOC~~ SOAJ: 5.0000 mg | SUBCUTANEOUS | 2 refills | Status: DC

## 2024-04-28 MED ORDER — LISINOPRIL 2.5 MG PO TABS: 2.5000 mg | ORAL_TABLET | Freq: Every day | ORAL | 1 refills | Status: DC

## 2024-04-28 MED ORDER — METFORMIN HCL 500 MG PO TABS: 1000.0000 mg | ORAL_TABLET | Freq: Two times a day (BID) | ORAL | 1 refills | Status: DC

## 2024-04-28 MED ORDER — TIRZEPATIDE 5 MG/0.5ML ~~LOC~~ SOAJ
5.0000 mg | SUBCUTANEOUS | 2 refills | Status: DC
Start: 2024-04-28 — End: 2024-11-01

## 2024-04-28 NOTE — Assessment & Plan Note (Signed)
 Chronic.  Continue with Rosuvastatin .  Labs ordered today. Follow up in 6 months.  Call sooner if concerns arise.

## 2024-04-28 NOTE — Assessment & Plan Note (Signed)
 Chronic.  Controlled.  Last A1c was 5.8%. Continue with current medication regimen of Mounjaro  5mg  weekly.  Eye exam requested.  Microalbumin up to date. Labs ordered today.  Return to clinic in 6 months for reevaluation.  Call sooner if concerns arise.

## 2024-04-28 NOTE — Telephone Encounter (Signed)
 Called patient's medications in for refills.  Received error in E prescribing several times.

## 2024-04-28 NOTE — Progress Notes (Signed)
 BP 122/79 (BP Location: Right Arm, Patient Position: Sitting, Cuff Size: Normal)   Pulse 73   Ht 5' 1.5" (1.562 m)   Wt 144 lb 9.6 oz (65.6 kg)   BMI 26.88 kg/m    Subjective:    Patient ID: Chelsea Mayer, female    DOB: Aug 04, 1971, 53 y.o.   MRN: [id number]  HPI: Chelsea Mayer is a 53 y.o. female  Chief Complaint  Patient presents with   Hypertension   HYPERTENSION / HYPERLIPIDEMIA Satisfied with current treatment? yes Duration of hypertension: years BP monitoring frequency: not checking BP range:  BP medication side effects: no Past BP meds: none Duration of hyperlipidemia: years Cholesterol medication side effects: no Cholesterol supplements: none Past cholesterol medications: none Medication compliance: excellent compliance Aspirin: no Recent stressors: no Recurrent headaches: no Visual changes: no Palpitations: no Dyspnea: no Chest pain: no Lower extremity edema: no Dizzy/lightheaded: no  DIABETES On Mounjaro  and doing well with the current dose of medication.  She is also taking Metformin  1000mg  BID.  She has lost 5 more lbs since last visit. Hypoglycemic episodes:no Polydipsia/polyuria: no Visual disturbance: no Chest pain: no Paresthesias: no Glucose Monitoring: no  Accucheck frequency: Not Checking  Fasting glucose:  Post prandial:  Evening:  Before meals: Taking Insulin?: no  Long acting insulin:  Short acting insulin: Blood Pressure Monitoring: not checking Retinal Examination: [facility] Foot Exam: Up to Date Diabetic Education: Not Completed Pneumovax: Given today Influenza: Up to Date Aspirin: no  Having some numbness in her thumb and first finger.  Comes and goes.  Lasts about 2 hours.  No pain.  Can come at anytime during the day.    Relevant past medical, surgical, family and social history reviewed and updated as indicated. Interim medical history since our last visit reviewed. Allergies and medications reviewed and  updated.  Review of Systems  Eyes:  Negative for visual disturbance.  Respiratory:  Negative for cough, chest tightness and shortness of breath.   Cardiovascular:  Negative for chest pain, palpitations and leg swelling.  Endocrine: Negative for polydipsia and polyuria.  Neurological:  Positive for numbness. Negative for dizziness and headaches.    Per HPI unless specifically indicated above     Objective:     BP 122/79 (BP Location: Right Arm, Patient Position: Sitting, Cuff Size: Normal)   Pulse 73   Ht 5' 1.5" (1.562 m)   Wt 144 lb 9.6 oz (65.6 kg)   BMI 26.88 kg/m   Wt Readings from Last 3 Encounters:  04/28/24 144 lb 9.6 oz (65.6 kg)  10/30/23 149 lb 3.2 oz (67.7 kg)  09/01/23 145 lb (65.8 kg)    Physical Exam Vitals and nursing note reviewed.  Constitutional:      General: She is not in acute distress.    Appearance: Normal appearance. She is normal weight. She is not ill-appearing, toxic-appearing or diaphoretic.  HENT:     Head: Normocephalic.     Right Ear: External ear normal.     Left Ear: External ear normal.     Nose: Nose normal.     Mouth/Throat:     Mouth: Mucous membranes are moist.     Pharynx: Oropharynx is clear.  Eyes:     General:        Right eye: No discharge.        Left eye: No discharge.     Extraocular Movements: Extraocular movements intact.     Conjunctiva/sclera: Conjunctivae normal.     Pupils:  Pupils are equal, round, and reactive to light.  Cardiovascular:     Rate and Rhythm: Normal rate and regular rhythm.     Heart sounds: No murmur heard. Pulmonary:     Effort: Pulmonary effort is normal. No respiratory distress.     Breath sounds: Normal breath sounds. No wheezing or rales.  Musculoskeletal:     Cervical back: Normal range of motion and neck supple.  Skin:    General: Skin is warm and dry.     Capillary Refill: Capillary refill takes less than 2 seconds.  Neurological:     General: No focal deficit present.     Mental  Status: She is alert and oriented to person, place, and time. Mental status is at baseline.  Psychiatric:        Mood and Affect: Mood normal.        Behavior: Behavior normal.        Thought Content: Thought content normal.        Judgment: Judgment normal.     Results for orders placed or performed in visit on 10/30/23  Urinalysis, Routine w reflex microscopic   Collection Time: 10/30/23  8:53 AM  Result Value Ref Range   Specific Gravity, UA 1.020 1.005 - 1.030   pH, UA 6.0 5.0 - 7.5   Color, UA Yellow Yellow   Appearance Ur Clear Clear   Leukocytes,UA Negative Negative   Protein,UA Negative Negative/Trace   Glucose, UA Negative Negative   Ketones, UA Negative Negative   RBC, UA Negative Negative   Bilirubin, UA Negative Negative   Urobilinogen, Ur 0.2 0.2 - 1.0 mg/dL   Nitrite, UA Negative Negative   Microscopic Examination Comment   CBC with Differential/Platelet   Collection Time: 10/30/23  8:54 AM  Result Value Ref Range   WBC 4.5 3.4 - 10.8 x10E3/uL   RBC 4.37 3.77 - 5.28 x10E6/uL   Hemoglobin 13.2 11.1 - 15.9 g/dL   Hematocrit 16.1 09.6 - 46.6 %   MCV 92 79 - 97 fL   MCH 30.2 26.6 - 33.0 pg   MCHC 32.7 31.5 - 35.7 g/dL   RDW 04.5 40.9 - 81.1 %   Platelets 271 150 - 450 x10E3/uL   Neutrophils 56 Not Estab. %   Lymphs 32 Not Estab. %   Monocytes 8 Not Estab. %   Eos 3 Not Estab. %   Basos 1 Not Estab. %   Neutrophils Absolute 2.6 1.4 - 7.0 x10E3/uL   Lymphocytes Absolute 1.4 0.7 - 3.1 x10E3/uL   Monocytes Absolute 0.4 0.1 - 0.9 x10E3/uL   EOS (ABSOLUTE) 0.1 0.0 - 0.4 x10E3/uL   Basophils Absolute 0.0 0.0 - 0.2 x10E3/uL   Immature Granulocytes 0 Not Estab. %   Immature Grans (Abs) 0.0 0.0 - 0.1 x10E3/uL  Comprehensive metabolic panel   Collection Time: 10/30/23  8:54 AM  Result Value Ref Range   Glucose 73 70 - 99 mg/dL   BUN 14 6 - 24 mg/dL   Creatinine, Ser 9.14 0.57 - 1.00 mg/dL   eGFR 782 >95 AO/ZHY/8.65   BUN/Creatinine Ratio 23 9 - 23   Sodium 142  134 - 144 mmol/L   Potassium 4.2 3.5 - 5.2 mmol/L   Chloride 105 96 - 106 mmol/L   CO2 24 20 - 29 mmol/L   Calcium  9.2 8.7 - 10.2 mg/dL   Total Protein 6.0 6.0 - 8.5 g/dL   Albumin 3.9 3.8 - 4.9 g/dL   Globulin, Total 2.1 1.5 -  4.5 g/dL   Bilirubin Total 0.4 0.0 - 1.2 mg/dL   Alkaline Phosphatase 104 44 - 121 IU/L   AST 19 0 - 40 IU/L   ALT 26 0 - 32 IU/L  Lipid panel   Collection Time: 10/30/23  8:54 AM  Result Value Ref Range   Cholesterol, Total 171 100 - 199 mg/dL   Triglycerides 68 0 - 149 mg/dL   HDL 69 >46 mg/dL   VLDL Cholesterol Cal 13 5 - 40 mg/dL   LDL Chol Calc (NIH) 89 0 - 99 mg/dL   Chol/HDL Ratio 2.5 0.0 - 4.4 ratio  TSH   Collection Time: 10/30/23  8:54 AM  Result Value Ref Range   TSH 1.440 0.450 - 4.500 uIU/mL  HgB A1c   Collection Time: 10/30/23  8:54 AM  Result Value Ref Range   Hgb A1c MFr Bld 5.8 (H) 4.8 - 5.6 %   Est. average glucose Bld gHb Est-mCnc 120 mg/dL      Assessment & Plan:   Problem List Items Addressed This Visit       Cardiovascular and Mediastinum   Hypertension   Chronic.  Well controlled.  Continue with ACE for kidney protection.  Refills sent today.  Labs ordered. Follow up in 6 months.  Call sooner if concerns arise.       Relevant Medications   lisinopril  (ZESTRIL ) 2.5 MG tablet   rosuvastatin  (CRESTOR ) 5 MG tablet   Other Relevant Orders   Comprehensive metabolic panel with GFR     Endocrine   Type 2 diabetes mellitus with diabetic neuropathy, without long-term current use of insulin (HCC)   Chronic.  Controlled.  Last A1c was 5.8%. Continue with current medication regimen of Mounjaro  5mg  weekly.  Eye exam requested.  Microalbumin up to date. Labs ordered today.  Return to clinic in 6 months for reevaluation.  Call sooner if concerns arise.       Relevant Medications   lisinopril  (ZESTRIL ) 2.5 MG tablet   metFORMIN  (GLUCOPHAGE ) 500 MG tablet   rosuvastatin  (CRESTOR ) 5 MG tablet   tirzepatide  (MOUNJARO ) 5 MG/0.5ML Pen    Other Relevant Orders   Comprehensive metabolic panel with GFR   Hemoglobin A1c   Microalbumin, Urine Waived     Other   Hyperlipidemia - Primary   Chronic.  Continue with Rosuvastatin .  Labs ordered today. Follow up in 6 months.  Call sooner if concerns arise.       Relevant Medications   lisinopril  (ZESTRIL ) 2.5 MG tablet   rosuvastatin  (CRESTOR ) 5 MG tablet   Other Relevant Orders   Lipid panel     Follow up plan: No follow-ups on file.

## 2024-04-28 NOTE — Assessment & Plan Note (Signed)
 Chronic.  Well controlled.  Continue with ACE for kidney protection.  Refills sent today.  Labs ordered. Follow up in 6 months.  Call sooner if concerns arise.

## 2024-04-29 ENCOUNTER — Encounter: Payer: Self-pay | Admitting: Nurse Practitioner

## 2024-04-29 LAB — COMPREHENSIVE METABOLIC PANEL WITH GFR
ALT: 64 IU/L — ABNORMAL HIGH (ref 0–32)
AST: 41 IU/L — ABNORMAL HIGH (ref 0–40)
Albumin: 4.5 g/dL (ref 3.8–4.9)
Alkaline Phosphatase: 101 IU/L (ref 44–121)
BUN/Creatinine Ratio: 25 — ABNORMAL HIGH (ref 9–23)
BUN: 15 mg/dL (ref 6–24)
Bilirubin Total: 0.3 mg/dL (ref 0.0–1.2)
CO2: 21 mmol/L (ref 20–29)
Calcium: 9.7 mg/dL (ref 8.7–10.2)
Chloride: 103 mmol/L (ref 96–106)
Creatinine, Ser: 0.59 mg/dL (ref 0.57–1.00)
Globulin, Total: 1.9 g/dL (ref 1.5–4.5)
Glucose: 75 mg/dL (ref 70–99)
Potassium: 4.5 mmol/L (ref 3.5–5.2)
Sodium: 140 mmol/L (ref 134–144)
Total Protein: 6.4 g/dL (ref 6.0–8.5)
eGFR: 109 mL/min/{1.73_m2} (ref >59)

## 2024-04-29 LAB — LIPID PANEL
Chol/HDL Ratio: 2.1 ratio (ref 0.0–4.4)
Cholesterol, Total: 161 mg/dL (ref 100–199)
HDL: 78 mg/dL (ref >39)
LDL Chol Calc (NIH): 73 mg/dL (ref 0–99)
Triglycerides: 48 mg/dL (ref 0–149)
VLDL Cholesterol Cal: 10 mg/dL (ref 5–40)

## 2024-04-29 LAB — HEMOGLOBIN A1C
Est. average glucose Bld gHb Est-mCnc: 120 mg/dL
Hgb A1c MFr Bld: 5.8 % — ABNORMAL HIGH (ref 4.8–5.6)

## 2024-11-01 ENCOUNTER — Ambulatory Visit: Admitting: Nurse Practitioner

## 2024-11-01 ENCOUNTER — Encounter: Payer: Self-pay | Admitting: Nurse Practitioner

## 2024-11-01 VITALS — BP 127/76 | HR 80 | Temp 98.0°F | Ht 61.5 in | Wt 150.2 lb

## 2024-11-01 DIAGNOSIS — E785 Hyperlipidemia, unspecified: Secondary | ICD-10-CM | POA: Diagnosis not present

## 2024-11-01 DIAGNOSIS — Z0000 Encounter for general adult medical examination without abnormal findings: Secondary | ICD-10-CM

## 2024-11-01 DIAGNOSIS — E1140 Type 2 diabetes mellitus with diabetic neuropathy, unspecified: Secondary | ICD-10-CM

## 2024-11-01 DIAGNOSIS — Z7985 Long-term (current) use of injectable non-insulin antidiabetic drugs: Secondary | ICD-10-CM

## 2024-11-01 DIAGNOSIS — I10 Essential (primary) hypertension: Secondary | ICD-10-CM | POA: Diagnosis not present

## 2024-11-01 DIAGNOSIS — Z23 Encounter for immunization: Secondary | ICD-10-CM | POA: Diagnosis not present

## 2024-11-01 DIAGNOSIS — Z1231 Encounter for screening mammogram for malignant neoplasm of breast: Secondary | ICD-10-CM | POA: Diagnosis not present

## 2024-11-01 MED ORDER — ROSUVASTATIN CALCIUM 5 MG PO TABS: 5.0000 mg | ORAL_TABLET | Freq: Every day | ORAL | 1 refills | Status: AC

## 2024-11-01 MED ORDER — TIRZEPATIDE 5 MG/0.5ML ~~LOC~~ SOAJ: 5.0000 mg | SUBCUTANEOUS | 2 refills | Status: AC

## 2024-11-01 MED ORDER — METFORMIN HCL 500 MG PO TABS: 1000.0000 mg | ORAL_TABLET | Freq: Two times a day (BID) | ORAL | 1 refills | Status: AC

## 2024-11-01 MED ORDER — LISINOPRIL 2.5 MG PO TABS: 2.5000 mg | ORAL_TABLET | Freq: Every day | ORAL | 1 refills | Status: AC

## 2024-11-01 NOTE — Progress Notes (Signed)
 BP 127/76   Pulse 80   Temp 98 F (36.7 C) (Oral)   Ht 5' 1.5 (1.562 m)   Wt 150 lb 3.2 oz (68.1 kg)   LMP  (LMP Unknown)   BMI 27.92 kg/m    Subjective:    Patient ID: Chelsea Mayer, female    DOB: 1971-07-26, 53 y.o.   MRN: [id number]  HPI: Chelsea Mayer is a 53 y.o. female presenting on 11/01/2024 for comprehensive medical examination. Current medical complaints include:none  She currently lives with: Menopausal Symptoms: yes  HYPERTENSION / HYPERLIPIDEMIA Satisfied with current treatment? yes Duration of hypertension: years BP monitoring frequency: not checking BP range:  BP medication side effects: no Past BP meds: none Duration of hyperlipidemia: years Cholesterol medication side effects: no Cholesterol supplements: none Past cholesterol medications: none Medication compliance: excellent compliance Aspirin: no Recent stressors: no Recurrent headaches: no Visual changes: no Palpitations: no Dyspnea: no Chest pain: no Lower extremity edema: no Dizzy/lightheaded: no  DIABETES Taking Mounjaro  5mg  and Metformin  1000mg  BID Hypoglycemic episodes:no Polydipsia/polyuria: no Visual disturbance: no Chest pain: no Paresthesias: no Glucose Monitoring: no  Accucheck frequency: Not Checking  Fasting glucose:  Post prandial:  Evening:  Before meals: Taking Insulin?: no  Long acting insulin:  Short acting insulin: Blood Pressure Monitoring: not checking Retinal Examination: Scheduled for this month Foot Exam: Up to Date Diabetic Education: Not Completed Pneumovax: Given today Influenza: Up to Date Aspirin: no  Depression Screen done today and results listed below:     11/01/2024    8:36 AM 04/28/2024    8:28 AM 09/01/2023    9:54 AM 07/31/2023    3:42 PM 04/24/2023    8:11 AM  Depression screen PHQ 2/9  Decreased Interest 1 1 1 2 1   Down, Depressed, Hopeless 0 0 0 0 0  PHQ - 2 Score 1 1 1 2 1   Altered sleeping 1 1 1 2 1   Tired, decreased energy 1 1  1 2 1   Change in appetite 0 0 0 0 0  Feeling bad or failure about yourself  0 0 0 0 0  Trouble concentrating 0 0 0 0 0  Moving slowly or fidgety/restless 0 0 0 0   Suicidal thoughts 0 0 0 0 0  PHQ-9 Score 3 3  3  6  3    Difficult doing work/chores Not difficult at all Not difficult at all Not difficult at all Not difficult at all      Data saved with a previous flowsheet row definition    The patient does not have a history of falls. I did complete a risk assessment for falls. A plan of care for falls was documented.   Past Medical History:  Past Medical History:  Diagnosis Date   Hyperlipidemia    Hypertension     Surgical History:  Past Surgical History:  Procedure Laterality Date   CHOLECYSTECTOMY     COLONOSCOPY WITH PROPOFOL  N/A 12/05/2022   Procedure: COLONOSCOPY WITH PROPOFOL ;  Surgeon: Therisa Bi, MD;  Location: [facility] ENDOSCOPY;  Service: Gastroenterology;  Laterality: N/A;   GASTRIC BYPASS  2009    Medications:  No current outpatient medications on file prior to visit.   No current facility-administered medications on file prior to visit.    Allergies:  No Known Allergies  Social History:  Social History   Socioeconomic History   Marital status: Married    Spouse name: Not on file   Number of children: Not on file   Years  of education: Not on file   Highest education level: Not on file  Occupational History   Not on file  Tobacco Use   Smoking status: Former    Types: Cigarettes   Smokeless tobacco: Never   Tobacco comments:    States she quit a couple of months ago, unsure of date.   Vaping Use   Vaping status: Never Used  Substance and Sexual Activity   Alcohol use: Yes    Comment: occasional   Drug use: No   Sexual activity: Yes  Other Topics Concern   Not on file  Social History Narrative   Not on file   Social Drivers of Health   Financial Resource Strain: Not on file  Food Insecurity: Not on file  Transportation Needs: Not on  file  Physical Activity: Not on file  Stress: Not on file  Social Connections: Not on file  Intimate Partner Violence: Not on file   Social History   Tobacco Use  Smoking Status Former   Types: Cigarettes  Smokeless Tobacco Never  Tobacco Comments   States she quit a couple of months ago, unsure of date.    Social History   Substance and Sexual Activity  Alcohol Use Yes   Comment: occasional    Family History:  Family History  Problem Relation Age of Onset   Breast cancer Mother 73   Cancer Mother        breast   Diabetes Father    Allergies Daughter     Past medical history, surgical history, medications, allergies, family history and social history reviewed with patient today and changes made to appropriate areas of the chart.   Review of Systems  HENT:         Denies vision changes.  Eyes:  Negative for blurred vision and double vision.  Respiratory:  Negative for shortness of breath.   Cardiovascular:  Negative for chest pain, palpitations and leg swelling.  Neurological:  Negative for dizziness, tingling and headaches.  Endo/Heme/Allergies:  Negative for polydipsia.       Denies Polyuria   All other ROS negative except what is listed above and in the HPI.      Objective:    BP 127/76   Pulse 80   Temp 98 F (36.7 C) (Oral)   Ht 5' 1.5 (1.562 m)   Wt 150 lb 3.2 oz (68.1 kg)   LMP  (LMP Unknown)   BMI 27.92 kg/m   Wt Readings from Last 3 Encounters:  11/01/24 150 lb 3.2 oz (68.1 kg)  04/28/24 144 lb 9.6 oz (65.6 kg)  10/30/23 149 lb 3.2 oz (67.7 kg)    Physical Exam Vitals and nursing note reviewed.  Constitutional:      General: She is awake. She is not in acute distress.    Appearance: Normal appearance. She is well-developed. She is not ill-appearing.  HENT:     Head: Normocephalic and atraumatic.     Right Ear: Hearing, tympanic membrane, ear canal and external ear normal. No drainage.     Left Ear: Hearing, tympanic membrane, ear canal  and external ear normal. No drainage.     Nose: Nose normal.     Right Sinus: No maxillary sinus tenderness or frontal sinus tenderness.     Left Sinus: No maxillary sinus tenderness or frontal sinus tenderness.     Mouth/Throat:     Mouth: Mucous membranes are moist.     Pharynx: Oropharynx is clear. Uvula midline. No pharyngeal  swelling, oropharyngeal exudate or posterior oropharyngeal erythema.  Eyes:     General: Lids are normal.        Right eye: No discharge.        Left eye: No discharge.     Extraocular Movements: Extraocular movements intact.     Conjunctiva/sclera: Conjunctivae normal.     Pupils: Pupils are equal, round, and reactive to light.     Visual Fields: Right eye visual fields normal and left eye visual fields normal.  Neck:     Thyroid: No thyromegaly.     Vascular: No carotid bruit.     Trachea: Trachea normal.  Cardiovascular:     Rate and Rhythm: Normal rate and regular rhythm.     Heart sounds: Normal heart sounds. No murmur heard.    No gallop.  Pulmonary:     Effort: Pulmonary effort is normal. No accessory muscle usage or respiratory distress.     Breath sounds: Normal breath sounds.  Chest:  Breasts:    Right: Normal.     Left: Normal.  Abdominal:     General: Bowel sounds are normal.     Palpations: Abdomen is soft. There is no hepatomegaly or splenomegaly.     Tenderness: There is no abdominal tenderness.  Musculoskeletal:        General: Normal range of motion.     Cervical back: Normal range of motion and neck supple.     Right lower leg: No edema.     Left lower leg: No edema.  Lymphadenopathy:     Head:     Right side of head: No submental, submandibular, tonsillar, preauricular or posterior auricular adenopathy.     Left side of head: No submental, submandibular, tonsillar, preauricular or posterior auricular adenopathy.     Cervical: No cervical adenopathy.     Upper Body:     Right upper body: No supraclavicular, axillary or pectoral  adenopathy.     Left upper body: No supraclavicular, axillary or pectoral adenopathy.  Skin:    General: Skin is warm and dry.     Capillary Refill: Capillary refill takes less than 2 seconds.     Findings: No rash.  Neurological:     Mental Status: She is alert and oriented to person, place, and time.     Gait: Gait is intact.  Psychiatric:        Attention and Perception: Attention normal.        Mood and Affect: Mood normal.        Speech: Speech normal.        Behavior: Behavior normal. Behavior is cooperative.        Thought Content: Thought content normal.        Judgment: Judgment normal.     Results for orders placed or performed in visit on 04/29/24  HM DIABETES EYE EXAM   Collection Time: 04/21/24  9:37 AM  Result Value Ref Range   HM Diabetic Eye Exam No Retinopathy No Retinopathy      Assessment & Plan:   Problem List Items Addressed This Visit       Cardiovascular and Mediastinum   Hypertension   Chronic.  Well controlled.  Continue with ACE for kidney protection.  Refills sent today.  Labs ordered. Follow up in 6 months.  Call sooner if concerns arise.       Relevant Medications   lisinopril  (ZESTRIL ) 2.5 MG tablet   rosuvastatin  (CRESTOR ) 5 MG tablet     Endocrine  Type 2 diabetes mellitus with diabetic neuropathy, without long-term current use of insulin (HCC)   Chronic.  Controlled.  Last A1c was 5.8%. Continue with current medication regimen of Mounjaro  5mg  weekly and Metformin  1000mg  BID.  Refills sent today.  Eye exam up to date.  Microalbumin up to date. Labs ordered today.  Return to clinic in 6 months for reevaluation.  Call sooner if concerns arise.       Relevant Medications   lisinopril  (ZESTRIL ) 2.5 MG tablet   metFORMIN  (GLUCOPHAGE ) 500 MG tablet   rosuvastatin  (CRESTOR ) 5 MG tablet   tirzepatide  (MOUNJARO ) 5 MG/0.5ML Pen   Other Relevant Orders   Hemoglobin A1c     Other   Hyperlipidemia   Chronic.  Continue with Rosuvastatin .   Refills sent today.  Labs ordered today. Follow up in 6 months.  Call sooner if concerns arise.       Relevant Medications   lisinopril  (ZESTRIL ) 2.5 MG tablet   rosuvastatin  (CRESTOR ) 5 MG tablet   Other Relevant Orders   Lipid panel   Other Visit Diagnoses       Annual physical exam    -  Primary   Health maintenance reviewed during visit today.  Labs ordered.  Vaccines reviewed.  PAP up to date.  Mammogram ordered. Colonoscopy up to date.   Relevant Orders   CBC with Differential/Platelet   Comprehensive metabolic panel with GFR   Lipid panel   TSH   Hemoglobin A1c     Need for pneumococcal vaccination       Relevant Orders   Pneumococcal conjugate vaccine 20-valent (Prevnar 20) (Completed)     Encounter for screening mammogram for malignant neoplasm of breast       Relevant Orders   MM 3D SCREENING MAMMOGRAM BILATERAL BREAST        Follow up plan: Return in about 6 months (around 05/01/2025) for HTN, HLD, DM2 FU.   LABORATORY TESTING:  - Pap smear: pap done  IMMUNIZATIONS:   - Tdap: Tetanus vaccination status reviewed: last tetanus booster within 10 years. - Influenza: Up to date - Pneumovax: Administered today - Prevnar: Not applicable - HPV: Not applicable - Zostavax vaccine: Not applicable  SCREENING: -Mammogram: Ordered at last visit- reminded patient today  - Colonoscopy: Discussed at visit. Will wait until 51.  - Bone Density: Not applicable  -Hearing Test: Not applicable  -Spirometry: Not applicable   PATIENT COUNSELING:   Advised to take 1 mg of folate supplement per day if capable of pregnancy.   Sexuality: Discussed sexually transmitted diseases, partner selection, use of condoms, avoidance of unintended pregnancy  and contraceptive alternatives.   Advised to avoid cigarette smoking.  I discussed with the patient that most people either abstain from alcohol or drink within safe limits (<=14/week and <=4 drinks/occasion for males, <=7/weeks and <=  3 drinks/occasion for females) and that the risk for alcohol disorders and other health effects rises proportionally with the number of drinks per week and how often a drinker exceeds daily limits.  Discussed cessation/primary prevention of drug use and availability of treatment for abuse.   Diet: Encouraged to adjust caloric intake to maintain  or achieve ideal body weight, to reduce intake of dietary saturated fat and total fat, to limit sodium intake by avoiding high sodium foods and not adding table salt, and to maintain adequate dietary potassium and calcium  preferably from fresh fruits, vegetables, and low-fat dairy products.    stressed the importance of regular exercise  Injury prevention: Discussed safety belts, safety helmets, smoke detector, smoking near bedding or upholstery.   Dental health: Discussed importance of regular tooth brushing, flossing, and dental visits.    NEXT PREVENTATIVE PHYSICAL DUE IN 1 YEAR. Return in about 6 months (around 05/01/2025) for HTN, HLD, DM2 FU.

## 2024-11-01 NOTE — Assessment & Plan Note (Signed)
 Chronic.  Continue with Rosuvastatin .  Refills sent today.  Labs ordered today. Follow up in 6 months.  Call sooner if concerns arise.

## 2024-11-01 NOTE — Assessment & Plan Note (Signed)
 Chronic.  Controlled.  Last A1c was 5.8%. Continue with current medication regimen of Mounjaro  5mg  weekly and Metformin  1000mg  BID.  Refills sent today.  Eye exam up to date.  Microalbumin up to date. Labs ordered today.  Return to clinic in 6 months for reevaluation.  Call sooner if concerns arise.

## 2024-11-01 NOTE — Assessment & Plan Note (Signed)
 Chronic.  Well controlled.  Continue with ACE for kidney protection.  Refills sent today.  Labs ordered. Follow up in 6 months.  Call sooner if concerns arise.

## 2024-11-01 NOTE — Patient Instructions (Signed)
 Please call to schedule your mammogram: [facility] at [facility]  Address: [address], [place], [place] [address] Phone: [phone]  [facility] at [facility] [address] [place],  [place]  [address] Phone: [phone]

## 2024-11-02 ENCOUNTER — Ambulatory Visit: Payer: Self-pay | Admitting: Nurse Practitioner

## 2024-11-02 LAB — COMPREHENSIVE METABOLIC PANEL WITH GFR
ALT: 51 IU/L — ABNORMAL HIGH (ref 0–32)
AST: 37 IU/L (ref 0–40)
Albumin: 4.2 g/dL (ref 3.8–4.9)
Alkaline Phosphatase: 101 IU/L (ref 49–135)
BUN/Creatinine Ratio: 21 (ref 9–23)
BUN: 13 mg/dL (ref 6–24)
Bilirubin Total: 0.4 mg/dL (ref 0.0–1.2)
CO2: 23 mmol/L (ref 20–29)
Calcium: 9.4 mg/dL (ref 8.7–10.2)
Chloride: 104 mmol/L (ref 96–106)
Creatinine, Ser: 0.63 mg/dL (ref 0.57–1.00)
Globulin, Total: 2.1 g/dL (ref 1.5–4.5)
Glucose: 83 mg/dL (ref 70–99)
Potassium: 4.4 mmol/L (ref 3.5–5.2)
Sodium: 140 mmol/L (ref 134–144)
Total Protein: 6.3 g/dL (ref 6.0–8.5)
eGFR: 107 mL/min/1.73 (ref >59)

## 2024-11-02 LAB — CBC WITH DIFFERENTIAL/PLATELET
Basophils Absolute: 0 x10E3/uL (ref 0.0–0.2)
Basos: 1 %
EOS (ABSOLUTE): 0.1 x10E3/uL (ref 0.0–0.4)
Eos: 2 %
Hematocrit: 43.2 % (ref 34.0–46.6)
Hemoglobin: 14.1 g/dL (ref 11.1–15.9)
Immature Grans (Abs): 0 x10E3/uL (ref 0.0–0.1)
Immature Granulocytes: 0 %
Lymphocytes Absolute: 1.4 x10E3/uL (ref 0.7–3.1)
Lymphs: 25 %
MCH: 30.5 pg (ref 26.6–33.0)
MCHC: 32.6 g/dL (ref 31.5–35.7)
MCV: 93 fL (ref 79–97)
Monocytes Absolute: 0.4 x10E3/uL (ref 0.1–0.9)
Monocytes: 7 %
Neutrophils Absolute: 3.7 x10E3/uL (ref 1.4–7.0)
Neutrophils: 65 %
Platelets: 257 x10E3/uL (ref 150–450)
RBC: 4.63 x10E6/uL (ref 3.77–5.28)
RDW: 12.3 % (ref 11.7–15.4)
WBC: 5.7 x10E3/uL (ref 3.4–10.8)

## 2024-11-02 LAB — LIPID PANEL
Chol/HDL Ratio: 2.2 ratio (ref 0.0–4.4)
Cholesterol, Total: 165 mg/dL (ref 100–199)
HDL: 74 mg/dL (ref >39)
LDL Chol Calc (NIH): 77 mg/dL (ref 0–99)
Triglycerides: 74 mg/dL (ref 0–149)
VLDL Cholesterol Cal: 14 mg/dL (ref 5–40)

## 2024-11-02 LAB — TSH: TSH: 1.33 u[IU]/mL (ref 0.450–4.500)

## 2024-11-02 LAB — HEMOGLOBIN A1C
Est. average glucose Bld gHb Est-mCnc: 114 mg/dL
Hgb A1c MFr Bld: 5.6 % (ref 4.8–5.6)

## 2024-11-21 ENCOUNTER — Ambulatory Visit
Admission: RE | Admit: 2024-11-21 | Discharge: 2024-11-21 | Disposition: A | Source: Ambulatory Visit | Attending: Nurse Practitioner | Admitting: Nurse Practitioner

## 2024-11-21 DIAGNOSIS — Z1231 Encounter for screening mammogram for malignant neoplasm of breast: Secondary | ICD-10-CM

## 2025-05-03 ENCOUNTER — Ambulatory Visit: Admitting: Nurse Practitioner
